# Patient Record
Sex: Female | Born: 1955 | Race: White | Hispanic: No | Marital: Married | Smoking: Never smoker
Health system: Southern US, Community
[De-identification: ages and names within clinical notes are randomized; demographics above are authoritative.]

## PROBLEM LIST (undated history)

## (undated) DIAGNOSIS — K219 Gastro-esophageal reflux disease without esophagitis: Secondary | ICD-10-CM

## (undated) DIAGNOSIS — E785 Hyperlipidemia, unspecified: Secondary | ICD-10-CM

## (undated) DIAGNOSIS — F32A Depression, unspecified: Secondary | ICD-10-CM

## (undated) DIAGNOSIS — N200 Calculus of kidney: Secondary | ICD-10-CM

## (undated) DIAGNOSIS — F410 Panic disorder [episodic paroxysmal anxiety] without agoraphobia: Secondary | ICD-10-CM

## (undated) DIAGNOSIS — F329 Major depressive disorder, single episode, unspecified: Secondary | ICD-10-CM

## (undated) DIAGNOSIS — R519 Headache, unspecified: Secondary | ICD-10-CM

## (undated) HISTORY — DX: Calculus of kidney: N20.0

## (undated) HISTORY — DX: Major depressive disorder, single episode, unspecified: F32.9

## (undated) HISTORY — DX: Depression, unspecified: F32.A

## (undated) HISTORY — PX: APPENDECTOMY: SHX54

## (undated) HISTORY — PX: OTHER SURGICAL HISTORY: SHX169

---

## 2003-03-20 ENCOUNTER — Encounter: Payer: Self-pay | Admitting: Internal Medicine

## 2003-03-20 ENCOUNTER — Encounter: Admission: RE | Admit: 2003-03-20 | Discharge: 2003-03-20 | Payer: Self-pay | Admitting: Internal Medicine

## 2004-10-05 ENCOUNTER — Ambulatory Visit: Payer: Self-pay | Admitting: Internal Medicine

## 2005-06-03 ENCOUNTER — Other Ambulatory Visit: Admission: RE | Admit: 2005-06-03 | Discharge: 2005-06-03 | Payer: Self-pay | Admitting: Gynecology

## 2006-06-19 ENCOUNTER — Other Ambulatory Visit: Admission: RE | Admit: 2006-06-19 | Discharge: 2006-06-19 | Payer: Self-pay | Admitting: Gynecology

## 2007-07-04 LAB — CONVERTED CEMR LAB

## 2007-10-19 ENCOUNTER — Emergency Department (HOSPITAL_COMMUNITY): Admission: EM | Admit: 2007-10-19 | Discharge: 2007-10-19 | Payer: Self-pay | Admitting: Emergency Medicine

## 2008-04-01 ENCOUNTER — Ambulatory Visit: Payer: Self-pay | Admitting: Internal Medicine

## 2008-04-01 ENCOUNTER — Telehealth (INDEPENDENT_AMBULATORY_CARE_PROVIDER_SITE_OTHER): Payer: Self-pay | Admitting: *Deleted

## 2008-04-01 DIAGNOSIS — M549 Dorsalgia, unspecified: Secondary | ICD-10-CM | POA: Insufficient documentation

## 2008-04-01 DIAGNOSIS — Z87442 Personal history of urinary calculi: Secondary | ICD-10-CM

## 2008-04-01 DIAGNOSIS — F329 Major depressive disorder, single episode, unspecified: Secondary | ICD-10-CM | POA: Insufficient documentation

## 2008-04-01 LAB — CONVERTED CEMR LAB
Basophils Absolute: 0 10*3/uL (ref 0.0–0.1)
Eosinophils Absolute: 0.1 10*3/uL (ref 0.0–0.7)
HCT: 38.1 % (ref 36.0–46.0)
Ketones, urine, test strip: NEGATIVE
MCHC: 33.4 g/dL (ref 30.0–36.0)
MCV: 91.3 fL (ref 78.0–100.0)
Monocytes Absolute: 0.6 10*3/uL (ref 0.1–1.0)
Nitrite: NEGATIVE
Platelets: 253 10*3/uL (ref 150–400)
RDW: 12.3 % (ref 11.5–14.6)
Urobilinogen, UA: 0.2

## 2008-04-02 ENCOUNTER — Encounter: Payer: Self-pay | Admitting: Internal Medicine

## 2008-04-03 ENCOUNTER — Ambulatory Visit: Payer: Self-pay | Admitting: Cardiology

## 2008-04-03 ENCOUNTER — Encounter (INDEPENDENT_AMBULATORY_CARE_PROVIDER_SITE_OTHER): Payer: Self-pay | Admitting: General Surgery

## 2008-04-03 ENCOUNTER — Telehealth: Payer: Self-pay | Admitting: Internal Medicine

## 2008-04-03 ENCOUNTER — Ambulatory Visit (HOSPITAL_COMMUNITY): Admission: EM | Admit: 2008-04-03 | Discharge: 2008-04-04 | Payer: Self-pay | Admitting: Emergency Medicine

## 2008-04-10 ENCOUNTER — Ambulatory Visit: Payer: Self-pay | Admitting: Internal Medicine

## 2008-04-17 ENCOUNTER — Ambulatory Visit: Payer: Self-pay | Admitting: Family Medicine

## 2008-04-17 DIAGNOSIS — R5381 Other malaise: Secondary | ICD-10-CM

## 2008-04-17 DIAGNOSIS — G43909 Migraine, unspecified, not intractable, without status migrainosus: Secondary | ICD-10-CM | POA: Insufficient documentation

## 2008-04-17 DIAGNOSIS — R5383 Other fatigue: Secondary | ICD-10-CM

## 2008-04-21 LAB — CONVERTED CEMR LAB
ALT: 23 units/L (ref 0–35)
Alkaline Phosphatase: 80 units/L (ref 39–117)
CO2: 23 meq/L (ref 19–32)
Cholesterol: 266 mg/dL — ABNORMAL HIGH (ref 0–200)
Eosinophils Absolute: 0.2 10*3/uL (ref 0.0–0.7)
Folate: >20 ng/mL
LDL Cholesterol: 178 mg/dL — ABNORMAL HIGH (ref 0–99)
Lymphocytes Relative: 29 % (ref 12–46)
Lymphs Abs: 2.2 10*3/uL (ref 0.7–4.0)
MCV: 93.1 fL (ref 78.0–100.0)
Neutrophils Relative %: 61 % (ref 43–77)
Platelets: 293 10*3/uL (ref 150–400)
Sed Rate: 17 mm/hr (ref 0–22)
Sodium: 141 meq/L (ref 135–145)
Total Bilirubin: 0.4 mg/dL (ref 0.3–1.2)
Total Protein: 7.3 g/dL (ref 6.0–8.3)
Uric Acid, Serum: 4.9 mg/dL (ref 2.4–7.0)
VLDL: 38 mg/dL (ref 0–40)
Vitamin B-12: 595 pg/mL (ref 211–911)
WBC: 7.6 10*3/uL (ref 4.0–10.5)

## 2008-04-24 ENCOUNTER — Telehealth: Payer: Self-pay | Admitting: Family Medicine

## 2008-05-05 ENCOUNTER — Encounter: Payer: Self-pay | Admitting: Internal Medicine

## 2008-07-28 ENCOUNTER — Ambulatory Visit: Payer: Self-pay | Admitting: Family Medicine

## 2008-07-28 DIAGNOSIS — E785 Hyperlipidemia, unspecified: Secondary | ICD-10-CM

## 2008-07-28 DIAGNOSIS — J309 Allergic rhinitis, unspecified: Secondary | ICD-10-CM | POA: Insufficient documentation

## 2008-08-18 ENCOUNTER — Ambulatory Visit: Payer: Self-pay | Admitting: Occupational Medicine

## 2008-08-18 ENCOUNTER — Encounter (INDEPENDENT_AMBULATORY_CARE_PROVIDER_SITE_OTHER): Payer: Self-pay | Admitting: Occupational Medicine

## 2009-07-15 ENCOUNTER — Ambulatory Visit: Payer: Self-pay | Admitting: Family Medicine

## 2009-07-28 ENCOUNTER — Encounter: Admission: RE | Admit: 2009-07-28 | Discharge: 2009-07-28 | Payer: Self-pay | Admitting: Gynecology

## 2009-07-30 ENCOUNTER — Encounter: Payer: Self-pay | Admitting: Family Medicine

## 2009-08-05 ENCOUNTER — Encounter: Admission: RE | Admit: 2009-08-05 | Discharge: 2009-08-05 | Payer: Self-pay | Admitting: Gynecology

## 2009-10-11 ENCOUNTER — Emergency Department (HOSPITAL_COMMUNITY): Admission: EM | Admit: 2009-10-11 | Discharge: 2009-10-11 | Payer: Self-pay | Admitting: Emergency Medicine

## 2009-10-26 ENCOUNTER — Ambulatory Visit (HOSPITAL_BASED_OUTPATIENT_CLINIC_OR_DEPARTMENT_OTHER): Admission: RE | Admit: 2009-10-26 | Discharge: 2009-10-26 | Payer: Self-pay | Admitting: Urology

## 2010-06-22 ENCOUNTER — Ambulatory Visit: Payer: Self-pay | Admitting: Emergency Medicine

## 2010-08-03 ENCOUNTER — Encounter: Admission: RE | Admit: 2010-08-03 | Discharge: 2010-08-03 | Payer: Self-pay | Admitting: Gynecology

## 2010-08-19 ENCOUNTER — Ambulatory Visit: Payer: Self-pay | Admitting: Family Medicine

## 2010-09-21 ENCOUNTER — Ambulatory Visit: Payer: Self-pay | Admitting: Family Medicine

## 2010-09-21 ENCOUNTER — Encounter
Admission: RE | Admit: 2010-09-21 | Discharge: 2010-09-21 | Payer: Self-pay | Source: Home / Self Care | Attending: Family Medicine | Admitting: Family Medicine

## 2010-09-21 DIAGNOSIS — R112 Nausea with vomiting, unspecified: Secondary | ICD-10-CM | POA: Insufficient documentation

## 2010-09-21 DIAGNOSIS — R1013 Epigastric pain: Secondary | ICD-10-CM | POA: Insufficient documentation

## 2010-09-22 ENCOUNTER — Telehealth: Payer: Self-pay | Admitting: Family Medicine

## 2010-09-24 ENCOUNTER — Encounter
Admission: RE | Admit: 2010-09-24 | Discharge: 2010-09-24 | Payer: Self-pay | Source: Home / Self Care | Attending: Family Medicine | Admitting: Family Medicine

## 2010-10-05 ENCOUNTER — Encounter: Payer: Self-pay | Admitting: Family Medicine

## 2010-10-06 ENCOUNTER — Encounter: Payer: Self-pay | Admitting: Family Medicine

## 2010-11-02 NOTE — Assessment & Plan Note (Signed)
Summary: COTTON LODGED IN RIGHT EAR/TJ (procedure room)   Vital Signs:  Patient Profile:   55 Years Old Female CC:      q-tip end stuck in right ear Height:     61 inches Weight:      157 pounds O2 Sat:      100 % O2 treatment:    Room Air Temp:     98.5 degrees F oral Pulse rate:   84 / minute Resp:     12 per minute BP sitting:   116 / 73  (left arm) Cuff size:   regular  Pt. in pain?   no  Vitals Entered By: Lajean Saver RN (August 19, 2010 12:44 PM)                   Updated Prior Medication List: EFFEXOR XR 150 MG  XR24H-CAP (VENLAFAXINE HCL) Take 1 tablet by mouth once a day VIVELLE-DOT 0.1 MG/24HR  PTTW (ESTRADIOL) Apply once daily PROMETRIUM 100 MG  CAPS (PROGESTERONE MICRONIZED)  IMITREX 100 MG  TABS (SUMATRIPTAN SUCCINATE)  NASONEX 50 MCG/ACT SUSP (MOMETASONE FUROATE) 2 sprays each nostril daily  Current Allergies (reviewed today): ! VICODIN (HYDROCODONE-ACETAMINOPHEN)History of Present Illness Chief Complaint: q-tip end stuck in right ear History of Present Illness:  Subjective:  Patient believes that cotton tip of a cotton applicator may have become lodged in her right ear while she was cleaning ear this AM.  No pain  REVIEW OF SYSTEMS Constitutional Symptoms      Denies fever, chills, night sweats, weight loss, weight gain, and fatigue.  Eyes       Denies change in vision, eye pain, eye discharge, glasses, contact lenses, and eye surgery. Ear/Nose/Throat/Mouth       Denies hearing loss/aids, change in hearing, ear pain, ear discharge, dizziness, frequent runny nose, frequent nose bleeds, sinus problems, sore throat, hoarseness, and tooth pain or bleeding.  Respiratory       Denies dry cough, productive cough, wheezing, shortness of breath, asthma, bronchitis, and emphysema/COPD.  Cardiovascular       Denies murmurs, chest pain, and tires easily with exhertion.    Gastrointestinal       Denies stomach pain, nausea/vomiting, diarrhea, constipation,  blood in bowel movements, and indigestion. Genitourniary       Denies painful urination, kidney stones, and loss of urinary control. Neurological       Denies paralysis, seizures, and fainting/blackouts. Musculoskeletal       Denies muscle pain, joint pain, joint stiffness, decreased range of motion, redness, swelling, muscle weakness, and gout.  Skin       Denies bruising, unusual mles/lumps or sores, and hair/skin or nail changes.  Psych       Denies mood changes, temper/anger issues, anxiety/stress, speech problems, depression, and sleep problems. Other Comments: Patient was cleaning ears this AM and the end of a Q-tip became lodged in her right ear   Past History:  Past Medical History: Reviewed history from 04/17/2008 and no changes required. Depression Nephrolithiasis, hx of Dr. Nicholas Lose (GYN).   Past Surgical History: Reviewed history from 04/17/2008 and no changes required. G 2 P 2 ; open right knee surgery  1984;  calculus retrieval Colonoscopy 2008 neg Appendectomy-04/03/2008  Family History: Reviewed history from 06/22/2010 and no changes required. Father: Healthy Mother: CVA @ 41 Siblings: sister bipolar  Social History: Reviewed history from 04/17/2008 and no changes required. 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   Never  Smoked Alcohol use-yes Drug use-no Regular exercise-no   Objective:  No acute distress  Right ear:  Small piece of cerumin distally but no foreign body.  After lavage, still no foreign body, and tympanic membrane and external auditory canal appear normal. Assessment New Problems: CERUMEN IMPACTION, RIGHT (ICD-380.4)   The patient and/or caregiver has been counseled thoroughly with regard to medications prescribed including dosage, schedule, interactions, rationale for use, and possible side effects and they verbalize understanding.  Diagnoses and expected course of recovery discussed and will return if not  improved as expected or if the condition worsens. Patient and/or caregiver verbalized understanding.   Orders Added: 1)  Clear Outer Ear canal [69200] 2)  Est. Patient Level III [[address]]

## 2010-11-02 NOTE — Assessment & Plan Note (Signed)
Summary: UPPER RESPIRATORY   Vital Signs:  Patient Profile:   55 Years Old Female CC:      Cold & URI symptoms x 4 days Height:     61 inches O2 Sat:      100 % O2 treatment:    Room Air Temp:     97.7 degrees F oral Pulse rate:   88 / minute Resp:     14 per minute BP sitting:   113 / 74  (left arm) Cuff size:   regular  Vitals Entered By: Lajean Saver RN (June 22, 2010 8:52 AM)                  Updated Prior Medication List: ZANTAC 150 MAXIMUM STRENGTH 150 MG  TABS (RANITIDINE HCL) 1 two times a day ac EFFEXOR XR 150 MG  XR24H-CAP (VENLAFAXINE HCL) Take 1 tablet by mouth once a day VIVELLE-DOT 0.1 MG/24HR  PTTW (ESTRADIOL) Apply once daily PROMETRIUM 100 MG  CAPS (PROGESTERONE MICRONIZED)  IMITREX 100 MG  TABS (SUMATRIPTAN SUCCINATE)   Current Allergies (reviewed today): ! VICODIN (HYDROCODONE-ACETAMINOPHEN)History of Present Illness Chief Complaint: Cold & URI symptoms x 4 days History of Present Illness: Patient complains of onset of cold symptoms for 4 days.  She has been using Nyquil which is helping a little bit.  She gets these symptoms every fall. No sore throat + cough No pleuritic pain No wheezing + nasal congestion + post-nasal drainage + sinus pain/pressure No itchy/red eyes No earache No hemoptysis No SOB + chills/sweats No nausea No vomiting No abdominal pain No diarrhea No skin rashes No fatigue No myalgias No headache   REVIEW OF SYSTEMS Constitutional Symptoms       Complains of chills, night sweats, and fatigue.     Denies fever, weight loss, and weight gain.  Eyes       Complains of eye drainage.      Denies change in vision, eye pain, glasses, contact lenses, and eye surgery. Ear/Nose/Throat/Mouth       Complains of frequent runny nose, sinus problems, and sore throat.      Denies hearing loss/aids, change in hearing, ear pain, ear discharge, dizziness, frequent nose bleeds, hoarseness, and tooth pain or bleeding.   Respiratory       Complains of productive cough.      Denies dry cough, wheezing, shortness of breath, asthma, bronchitis, and emphysema/COPD.  Cardiovascular       Denies murmurs, chest pain, and tires easily with exhertion.    Gastrointestinal       Denies stomach pain, nausea/vomiting, diarrhea, constipation, blood in bowel movements, and indigestion. Genitourniary       Denies painful urination, kidney stones, and loss of urinary control. Neurological       Denies paralysis, seizures, and fainting/blackouts. Musculoskeletal       Denies muscle pain, joint pain, joint stiffness, decreased range of motion, redness, swelling, muscle weakness, and gout.  Skin       Denies bruising, unusual mles/lumps or sores, and hair/skin or nail changes.  Psych       Denies mood changes, temper/anger issues, anxiety/stress, speech problems, depression, and sleep problems.  Past History:  Past Medical History: Reviewed history from 04/17/2008 and no changes required. Depression Nephrolithiasis, hx of Dr. Nicholas Lose (GYN).   Past Surgical History: Reviewed history from 04/17/2008 and no changes required. G 2 P 2 ; open right knee surgery  1984;  calculus retrieval Colonoscopy 2008 neg Appendectomy-04/03/2008  Family History:  Father: Healthy Mother: CVA @ 34 Siblings: sister bipolar  Social History: Reviewed history from 04/17/2008 and no changes required. 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   Never Smoked Alcohol use-yes Drug use-no Regular exercise-no Physical Exam General appearance: well developed, well nourished, no acute distress Ears: normal, no lesions or deformities Nasal: clear discharge Oral/Pharynx: clear PND Neck: neck supple,  trachea midline, no masses Chest/Lungs: no rales, wheezes, or rhonchi bilateral, breath sounds equal without effort Heart: regular rate and  rhythm, no murmur Skin: no obvious rashes or lesions MSE: oriented to time,  place, and person Assessment New Problems: UPPER RESPIRATORY INFECTION, ACUTE (ICD-465.9)   Patient Education: Patient and/or caregiver instructed in the following: rest, fluids, Tylenol prn.  Plan New Medications/Changes: CHERATUSSIN AC 100-10 MG/5ML SYRP (GUAIFENESIN-CODEINE) 5cc by mouth every 6 hours as needed for cough  #5oz x 0, 06/22/2010, Hoyt Koch MD AMOXICILLIN 500 MG TABS (AMOXICILLIN) 1 tab by mouth three times a day for 10 days  #30 x 0, 06/22/2010, Hoyt Koch MD NASONEX 50 MCG/ACT SUSP (MOMETASONE FUROATE) 2 sprays each nostril daily  #1 x 3, 06/22/2010, Hoyt Koch MD  New Orders: Est. Patient Level II [69629] Planning Comments:   Follow-up with your primary care physician if not improving or if getting worse   The patient and/or caregiver has been counseled thoroughly with regard to medications prescribed including dosage, schedule, interactions, rationale for use, and possible side effects and they verbalize understanding.  Diagnoses and expected course of recovery discussed and will return if not improved as expected or if the condition worsens. Patient and/or caregiver verbalized understanding.  Prescriptions: CHERATUSSIN AC 100-10 MG/5ML SYRP (GUAIFENESIN-CODEINE) 5cc by mouth every 6 hours as needed for cough  #5oz x 0   Entered and Authorized by:   Hoyt Koch MD   Signed by:   Hoyt Koch MD on 06/22/2010   Method used:   Print then Give to Patient   RxID:   [id number] AMOXICILLIN 500 MG TABS (AMOXICILLIN) 1 tab by mouth three times a day for 10 days  #30 x 0   Entered and Authorized by:   Hoyt Koch MD   Signed by:   Hoyt Koch MD on 06/22/2010   Method used:   Print then Give to Patient   RxID:   [id number] NASONEX 50 MCG/ACT SUSP (MOMETASONE FUROATE) 2 sprays each nostril daily  #1 x 3   Entered and Authorized by:   Hoyt Koch MD   Signed by:   Hoyt Koch MD on 06/22/2010   Method  used:   Print then Give to Patient   RxID:   [id number]   Orders Added: 1)  Est. Patient Level II [60630]

## 2010-11-02 NOTE — Assessment & Plan Note (Signed)
Summary: FLU SHOT  Nurse Visit Flu Vaccine Consent Questions     Do you have a history of severe allergic reactions to this vaccine? no    Any prior history of allergic reactions to egg and/or gelatin? no    Do you have a sensitivity to the preservative Thimersol? no    Do you have a past history of Guillan-Barre Syndrome? no    Do you currently have an acute febrile illness? no    Have you ever had a severe reaction to latex? no    Vaccine information given and explained to patient? yes    Are you currently pregnant? no    Lot Number:[id number]   Exp Date:04/02/2011   Manufacturer: Designer, industrial/product Deltoid IM    Vital Signs:  Patient profile:   55 year old female Temp:     97.7 degrees F oral  Vitals Entered By: Lajean Saver RN (June 22, 2010 9:50 AM)  Allergies: 1)  ! Vicodin (Hydrocodone-Acetaminophen)  Orders Added: 1)  Admin 1st Vaccine [90471] 2)  Flu Vaccine 34yrs + [21308]

## 2010-11-04 NOTE — Progress Notes (Signed)
Summary: ultasound/ CT ?  Phone Note Call from Patient Call back at Work Phone [phone]   Caller: Patient Call For: Nani Gasser MD Summary of Call: Pt calls and states was given results of xray yesterday and was advised that she needed a CT. Wants to know if this has been scheduled. I looked i see per MD note that she just needs ultrasound. Can you look into this please behind me and clarify to pt Initial call taken by: Kathlene November LPN,  September 22, 2010 1:44 PM  Follow-up for Phone Call        called and left messag on vm . we scheduled her for an abd U/S Follow-up by: Avon Gully CMA, Duncan Dull),  September 22, 2010 2:27 PM

## 2010-11-04 NOTE — Assessment & Plan Note (Signed)
Summary: Abdominal Pain and Nausea   Vital Signs:  Patient profile:   55 year old female Height:      61 inches Weight:      153 pounds Pulse rate:   58 / minute BP sitting:   106 / 66  (right arm) Cuff size:   regular  Vitals Entered By: Avon Gully CMA, Duncan Dull) (September 21, 2010 9:26 AM) CC: abd pain and nausea 2-3 weeks, went to urgent care and they did ct of abd and pt was very backed up, pt has not had a BM in a week   Primary Care Provider:  Nani Gasser, MD  CC:  abd pain and nausea 2-3 weeks, went to urgent care and they did ct of abd and pt was very backed up, and pt has not had a BM in a week.  History of Present Illness: abd pain and nausea 2-3 weeks, went to urgent care and they did Plain xray  of abd and pt was very backed up, pt has not had a BM in a week. Nuase is constant.  Dec appetitie.  No fever.  Does have some chronic constipation.  Some discomfort in her mid back.  Woke up very nauseated after going to a party the night before. No alcohol. Would get cramping after eating. Took  a laxative last monday to have a BM.  Then on Sunday has nausea and vomiting about an 1.5 after eating. Had similar sxs in july but resolved on its own.  Says has blood work that was normal. Given omeprazole and odansatran.  these medications helped some.nothing significantly improved her pain.  Most of her stomach discomfort is in the epigastric area.  Current Medications (verified): 1)  Vivelle-Dot 0.1 Mg/24hr  Pttw (Estradiol) .... Apply Once Daily 2)  Prometrium 100 Mg  Caps (Progesterone Micronized) 3)  Imitrex 100 Mg  Tabs (Sumatriptan Succinate) 4)  Nasonex 50 Mcg/act Susp (Mometasone Furoate) .... 2 Sprays Each Nostril Daily 5)  Cymbalta 60 Mg Cpep (Duloxetine Hcl) .... Take One Tablet By Mouth Once A Day  Allergies (verified): 1)  ! Vicodin (Hydrocodone-Acetaminophen)  Comments:  Nurse/Medical Assistant: The patient's medications and allergies were reviewed with the  patient and were updated in the Medication and Allergy Lists. Avon Gully CMA, Duncan Dull) (September 21, 2010 9:28 AM)  Past History:  Past Medical History: Last updated: 04/17/2008 Depression Nephrolithiasis, hx of Dr. Nicholas Lose (GYN).   Past Surgical History: Last updated: 04/17/2008 G 2 P 2 ; open right knee surgery  1984;  calculus retrieval Colonoscopy 2008 neg Appendectomy-04/03/2008  Family History: Reviewed history from 06/22/2010 and no changes required. Father: Healthy Mother: CVA @ 68 Siblings: sister bipolar  Social History: Reviewed history from 04/17/2008 and no changes required. 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   Never Smoked Alcohol use-yes Drug use-no Regular exercise-no  Physical Exam  General:  Well-developed,well-nourished,in no acute distress; alert,appropriate and cooperative throughout examination Head:  Normocephalic and atraumatic without obvious abnormalities. No apparent alopecia or balding. Eyes:  No corneal or conjunctival inflammation noted. EOMI. Perrla.  Ears:  External ear exam shows no significant lesions or deformities.  Otoscopic examination reveals clear canals, tympanic membranes are intact bilaterally without bulging, retraction, inflammation or discharge. Hearing is grossly normal bilaterally. Mouth:  Oral mucosa and oropharynx without lesions or exudates.  Teeth in good repair. Neck:  No deformities, masses, or tenderness noted. no thyromegaly. Lungs:  Normal respiratory effort, chest expands symmetrically. Lungs are  clear to auscultation, no crackles or wheezes. Heart:  Normal rate and regular rhythm. S1 and S2 normal without gallop, murmur, click, rub or other extra sounds. Abdomen:  Bowel sounds positive,abdomen soft without masses, organomegaly or hernias noted. diffusely tender. Pulses:  radial pulses 2+ bilaterally Extremities:  no lower extremity edema Skin:  no rashes.   Cervical Nodes:  No  lymphadenopathy noted Psych:  Cognition and judgment appear intact. Alert and cooperative with normal attention span and concentration. No apparent delusions, illusions, hallucinations   Impression & Recommendations:  Problem # 1:  EPIGASTRIC PAIN (ICD-789.06) we discussed options.  I think the most important thing is to start with a KUB again and make sure that she is cleared the stool from her bowels.  I would also like to get an ultrasound of her gallbladder to make sure she does not have cholecystitis.  She evidently had normal blood counts done but I do not have a copy of these to look at today.  If she is still full of stool then they will be unable to do the ultrasound, that I am starting with the x-ray today.  She can continue the omeprazole and he again is drawn as needed for stomach upset and for nausea.  If the pain becomes severe or she starts vomiting again to seek a immediately to the emergency department. Orders: T-*Unlisted Diagnostic X-ray test/procedure (60454)  Problem # 2:  NAUSEA AND VOMITING (ICD-787.01) we discussed options.  I think the most important thing is to start with a KUB again and make sure that she is cleared the stool from her bowels.  I would also like to get an ultrasound of her gallbladder to make sure she does not have cholecystitis.  She evidently had normal blood counts done but I do not have a copy of these to look at today.  If she is still full of stool then they will be unable to do the ultrasound, that I am starting with the x-ray today.  She can continue the omeprazole and he again is drawn as needed for stomach upset and for nausea.  If the pain becomes severe or she starts vomiting again to seek a immediately to the emergency department. Orders: T-*Unlisted Diagnostic X-ray test/procedure ([id number])  Complete Medication List: 1)  Vivelle-dot 0.1 Mg/24hr Pttw (Estradiol) .... Apply once daily 2)  Prometrium 100 Mg Caps (Progesterone micronized) 3)   Imitrex 100 Mg Tabs (Sumatriptan succinate) 4)  Nasonex 50 Mcg/act Susp (Mometasone furoate) .... 2 sprays each nostril daily 5)  Cymbalta 60 Mg Cpep (Duloxetine hcl) .... Take one tablet by mouth once a day   Orders Added: 1)  T-*Unlisted Diagnostic X-ray test/procedure [91478] 2)  Est. Patient Level IV [29562]

## 2010-11-04 NOTE — Consult Note (Signed)
Summary: Arloa Koh [facility]   Imported By: Lanelle Bal 10/28/2010 08:49:40  _____________________________________________________________________  External Attachment:    Type:   Image     Comment:   External Document

## 2010-12-19 LAB — CBC
HCT: 42 % (ref 36.0–46.0)
Hemoglobin: 14 g/dL (ref 12.0–15.0)
MCHC: 33.4 g/dL (ref 30.0–36.0)
MCV: 90.1 fL (ref 78.0–100.0)
Platelets: 237 K/uL (ref 150–400)
RBC: 4.66 MIL/uL (ref 3.87–5.11)
RDW: 14.1 % (ref 11.5–15.5)
WBC: 7.5 K/uL (ref 4.0–10.5)

## 2010-12-19 LAB — COMPREHENSIVE METABOLIC PANEL WITH GFR
ALT: 19 U/L (ref 0–35)
AST: 23 U/L (ref 0–37)
Albumin: 3.8 g/dL (ref 3.5–5.2)
Alkaline Phosphatase: 87 U/L (ref 39–117)
BUN: 8 mg/dL (ref 6–23)
CO2: 26 meq/L (ref 19–32)
Calcium: 9.3 mg/dL (ref 8.4–10.5)
Chloride: 106 meq/L (ref 96–112)
Creatinine, Ser: 0.76 mg/dL (ref 0.4–1.2)
GFR calc non Af Amer: >60 mL/min
Glucose, Bld: 136 mg/dL — ABNORMAL HIGH (ref 70–99)
Potassium: 3.6 meq/L (ref 3.5–5.1)
Sodium: 140 meq/L (ref 135–145)
Total Bilirubin: 0.6 mg/dL (ref 0.3–1.2)
Total Protein: 7.5 g/dL (ref 6.0–8.3)

## 2010-12-19 LAB — URINALYSIS, ROUTINE W REFLEX MICROSCOPIC
Bilirubin Urine: NEGATIVE
Nitrite: NEGATIVE
Specific Gravity, Urine: 1.014 (ref 1.005–1.030)
pH: 7.5 (ref 5.0–8.0)

## 2010-12-19 LAB — LIPASE, BLOOD: Lipase: 23 U/L (ref 11–59)

## 2010-12-19 LAB — URINE CULTURE
Colony Count: NO GROWTH
Culture: NO GROWTH

## 2010-12-19 LAB — URINE MICROSCOPIC-ADD ON

## 2010-12-19 LAB — DIFFERENTIAL
Eosinophils Relative: 1 % (ref 0–5)
Lymphocytes Relative: 20 % (ref 12–46)
Lymphs Abs: 1.5 10*3/uL (ref 0.7–4.0)
Monocytes Absolute: 0.4 10*3/uL (ref 0.1–1.0)
Neutro Abs: 5.4 10*3/uL (ref 1.7–7.7)

## 2010-12-19 LAB — POCT HEMOGLOBIN-HEMACUE: Hemoglobin: 13.6 g/dL (ref 12.0–15.0)

## 2010-12-27 ENCOUNTER — Inpatient Hospital Stay (INDEPENDENT_AMBULATORY_CARE_PROVIDER_SITE_OTHER)
Admission: RE | Admit: 2010-12-27 | Discharge: 2010-12-27 | Disposition: A | Discharge status: Home / Self Care | Payer: 59 | Source: Ambulatory Visit | Attending: Family Medicine | Admitting: Family Medicine

## 2010-12-27 ENCOUNTER — Encounter: Payer: Self-pay | Admitting: Family Medicine

## 2010-12-27 DIAGNOSIS — M545 Low back pain: Secondary | ICD-10-CM

## 2010-12-27 DIAGNOSIS — M533 Sacrococcygeal disorders, not elsewhere classified: Secondary | ICD-10-CM

## 2011-01-04 NOTE — Letter (Signed)
Summary: Internal Correspondence  Internal Correspondence   Imported By: Dannette Barbara 12/27/2010 10:18:31  _____________________________________________________________________  External Attachment:    Type:   Image     Comment:   External Document

## 2011-01-04 NOTE — Letter (Signed)
Summary: Out of Work  [facility]  [address]   [place], [place] [address]   Phone: [phone]  Fax: [phone]    December 27, 2010   Employee:  Connie May Ton    To Whom It May Concern:   For Medical reasons, please excuse the above named employee from work today and tomorrow.   If you need additional information, please feel free to contact our office.         Sincerely,    Donna Christen MD

## 2011-01-04 NOTE — Letter (Signed)
Summary: External Correspondence  External Correspondence   Imported By: Dannette Barbara 12/27/2010 11:00:35  _____________________________________________________________________  External Attachment:    Type:   Image     Comment:   External Document

## 2011-01-04 NOTE — Assessment & Plan Note (Signed)
Summary: LOWER BACK PAIN? room 5   Vital Signs:  Patient Profile:   55 Years Old Female CC:      Lower back pain x 2 days Height:     61 inches Weight:      153 pounds O2 Sat:      96 % O2 treatment:    Room Air Temp:     98.3 degrees F oral Pulse rate:   80 / minute Pulse rhythm:   regular Resp:     16 per minute BP sitting:   112 / 72  (left arm) Cuff size:   regular  Vitals Entered By: Emilio Math (December 27, 2010 9:27 AM)                  Current Allergies (reviewed today): ! VICODIN (HYDROCODONE-ACETAMINOPHEN)History of Present Illness Chief Complaint: Lower back pain x 2 days History of Present Illness:  Subjective:  Patient was passenger in a auto two days ago for 9 hours.  She noticed bilateral aching in her lower back after the trip, and the pain has gradually migrated up to her mid-back.  The pain is worse with movement, and awakens her at night.  No bowel or bladder dysfunction.  No saddle numbness.  No rash.  No fevers, chills, and sweats   Current Meds VIVELLE-DOT 0.1 MG/24HR  PTTW (ESTRADIOL) Apply once daily PROMETRIUM 100 MG  CAPS (PROGESTERONE MICRONIZED)  IMITREX 100 MG  TABS (SUMATRIPTAN SUCCINATE)  NASONEX 50 MCG/ACT SUSP (MOMETASONE FUROATE) 2 sprays each nostril daily CYMBALTA 60 MG CPEP (DULOXETINE HCL) take one tablet by mouth once a day NAPROXEN 375 MG TABS (NAPROXEN) 1 by mouth q12hr pc SKELAXIN 800 MG TABS (METAXALONE) 1 by mouth two times a day to three times a day as needed PERCOCET 5-325 MG TABS (OXYCODONE-ACETAMINOPHEN) 1 by mouth hs as needed pain  REVIEW OF SYSTEMS Constitutional Symptoms      Denies fever, chills, night sweats, weight loss, weight gain, and fatigue.  Eyes       Denies change in vision, eye pain, eye discharge, glasses, contact lenses, and eye surgery. Ear/Nose/Throat/Mouth       Denies hearing loss/aids, change in hearing, ear pain, ear discharge, dizziness, frequent runny nose, frequent nose bleeds, sinus problems,  sore throat, hoarseness, and tooth pain or bleeding.  Respiratory       Denies dry cough, productive cough, wheezing, shortness of breath, asthma, bronchitis, and emphysema/COPD.  Cardiovascular       Denies murmurs, chest pain, and tires easily with exhertion.    Gastrointestinal       Denies stomach pain, nausea/vomiting, diarrhea, constipation, blood in bowel movements, and indigestion. Genitourniary       Denies painful urination, kidney stones, and loss of urinary control. Neurological       Denies paralysis, seizures, and fainting/blackouts. Musculoskeletal       Complains of muscle pain, joint pain, joint stiffness, and decreased range of motion.      Denies redness, swelling, muscle weakness, and gout.  Skin       Denies bruising, unusual mles/lumps or sores, and hair/skin or nail changes.  Psych       Denies mood changes, temper/anger issues, anxiety/stress, speech problems, depression, and sleep problems.  Past History:  Past Medical History: Reviewed history from 04/17/2008 and no changes required. Depression Nephrolithiasis, hx of Dr. Nicholas Lose (GYN).   Past Surgical History: Reviewed history from 04/17/2008 and no changes required. G 2 P 2 ; open right  knee surgery  1984;  calculus retrieval Colonoscopy 2008 neg Appendectomy-04/03/2008  Family History: Reviewed history from 06/22/2010 and no changes required. Father: Healthy Mother: CVA @ 42 Siblings: sister bipolar  Social History: Reviewed history from 04/17/2008 and no changes required. 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   Never Smoked Alcohol use-yes Drug use-no Regular exercise-no   Objective:  Appearance:  Patient appears healthy, stated age, and in no acute distress.  However, she appears to be more comfortable standing Eyes:  Pupils are equal, round, and reactive to light and accomdation.  Extraocular movement is intact.  Conjunctivae are not inflamed.  Neck:   Supple Lungs:  Clear to auscultation.  Breath sounds are equal.  Heart:  Regular rate and rhythm without murmurs, rubs, or gallops.  Abdomen:  Nontender without masses or hepatosplenomegaly.  Bowel sounds are present.  No CVA or flank tenderness.   Back:  Decreased range of motion.  Can heel/toe walk and squat without difficulty.  Tenderness in lumbar and  Sacral area.  Straight leg raising test is negative.  Sitting knee extension test is negative.  Strength and sensation in the lower extremities is normal.  Patellar and achilles reflexes are normal.  Extremities:  No edema.   Skin:  No rash Assessment New Problems: BACK PAIN, LUMBAR (ICD-724.2) SACROILIAC JOINT DYSFUNCTION (ICD-724.6)   Plan New Medications/Changes: PERCOCET 5-325 MG TABS (OXYCODONE-ACETAMINOPHEN) 1 by mouth hs as needed pain  #5 (five) x 0, 12/27/2010, Donna Christen MD SKELAXIN 800 MG TABS (METAXALONE) 1 by mouth two times a day to three times a day as needed  #15 x 0, 12/27/2010, Donna Christen MD NAPROXEN 375 MG TABS (NAPROXEN) 1 by mouth q12hr pc  #20 x 0, 12/27/2010, Donna Christen MD  New Orders: Est. Patient Level III [phone] Planning Comments:   Begin applying ice pack several times daily.  Begin NSAID and muscle relaxant.  Analgesic at bedtime Begin back exercises ([facility] information and instruction patient handout given)  Followup with [facility] if not improved in two weeks.    The patient and/or caregiver has been counseled thoroughly with regard to medications prescribed including dosage, schedule, interactions, rationale for use, and possible side effects and they verbalize understanding.  Diagnoses and expected course of recovery discussed and will return if not improved as expected or if the condition worsens. Patient and/or caregiver verbalized understanding.  Prescriptions: PERCOCET 5-325 MG TABS (OXYCODONE-ACETAMINOPHEN) 1 by mouth hs as needed pain  #5 (five) x 0   Entered and  Authorized by:   Donna Christen MD   Signed by:   Donna Christen MD on 12/27/2010   Method used:   Print then Give to Patient   RxID:   [id number] SKELAXIN 800 MG TABS (METAXALONE) 1 by mouth two times a day to three times a day as needed  #15 x 0   Entered and Authorized by:   Donna Christen MD   Signed by:   Donna Christen MD on 12/27/2010   Method used:   Print then Give to Patient   RxID:   [id number] NAPROXEN 375 MG TABS (NAPROXEN) 1 by mouth q12hr pc  #20 x 0   Entered and Authorized by:   Donna Christen MD   Signed by:   Donna Christen MD on 12/27/2010   Method used:   Print then Give to Patient   RxID:   [id number]   Orders Added: 1)  Est. Patient Level III [25366]

## 2011-01-10 ENCOUNTER — Telehealth: Payer: Self-pay | Admitting: *Deleted

## 2011-01-10 NOTE — Telephone Encounter (Signed)
Pt called stating sister has been Dx w/ Hep C and would like to know if you think it is a good idea for her to get tested as well. Please advise.

## 2011-01-10 NOTE — Telephone Encounter (Signed)
Call pt: It is easy to test for .  We just order lab work.  Can do at her next visit or we can get it any time.

## 2011-01-11 NOTE — Telephone Encounter (Signed)
Pt aware of the above  

## 2011-02-15 NOTE — H&P (Signed)
NAME:  Connie May, Connie May              ACCOUNT NO.:  [id number]   MEDICAL RECORD NO.:  [id number]          PATIENT TYPE:  INP   LOCATION:  0098                         FACILITY:  [facility]   PHYSICIAN:  Angelia Mould. Derrell Lolling, M.D.DATE OF BIRTH:  21-Nov-1955   DATE OF ADMISSION:  04/03/2008  DATE OF DISCHARGE:                              HISTORY & PHYSICAL   CHIEF COMPLAINT:  Right lower quadrant pain and nausea.   HISTORY OF PRESENT ILLNESS:  This is a healthy 55 year old white female  sent to me through the courtesy of Dr. Marga Melnick in [place].   The patient states that she developed right lower quadrant and right  flank pain about 5 days ago.  This has not gone away.  She has been  constipated, although did have a bowel movement today.  She has been  nauseated every day, but has not vomited.  She had some hot and cold  feelings, but no shaking chills and no fever.  She is been voiding okay.   She saw Dr. Alwyn Ren on June 30th.  Because of her history of kidney  stones, he got a urinalysis and urine culture and started her on Cipro.  The culture really is not showing anything.  A CT scan was done today  which shows some thickening and stranding around the tip of the appendix  and was read as being suspicious for appendicitis by the radiologist.  No other abnormalities were seen on the CT scan. Spoke with Dr. Alwyn Ren  and the patient by phone.  Because she was having continuing pain, I  asked her to come to [facility] emergency room.  I have evaluated her  and I feel that there is a moderately high probability that she has  acute appendicitis.   PAST HISTORY:  1. Kidney stones treated by Dr. Etta Grandchild in the past in 1994.  2. She is being treated for depression.  3. She had an open right knee surgery for some type of patellar tendon      repair.  4. She had a kidney stone removed.   CURRENT MEDICATIONS:  1. Hormone replacement therapy.  2. Cipro.  3. Effexor.   DRUG ALLERGIES:  None  known.   SOCIAL HISTORY:  She is married, lives in [place] and has two sons.  She works as an Investment banker, corporate person.  Denies  tobacco use.  Drinks alcohol rarely.   FAMILY HISTORY:  Mother and father living and well.  They really do not  have any major medical problems.   REVIEW OF SYSTEMS:  All systems are reviewed and 15 system review of  systems is performed and is noncontributory except as described above.   PHYSICAL EXAMINATION:  Extremely pleasant middle-aged white female in no  major distress.  She is ambulatory and her husband is with her.  Temperature 98.3, pulse 96, respirations 16, blood pressure 134/69.  EYES:  Sclerae clear.  Extraocular movements are intact.  Ears, mouth,  throat, nose, lips, tongue and oropharynx without gross lesions.  NECK: Supple, nontender.  No mass.  No jugular venous distention.  LUNGS: Clear to auscultation.  No chest wall tenderness.  No  costovertebral angle tenderness.  HEART:  Regular rate and rhythm.  No murmur.  No ectopy.  Radial and  femoral pulses are palpable.  No peripheral edema.  ABDOMEN:  Soft.  She  has localized tenderness and involuntary guarding in the right lower  quadrant.  There is no back tenderness or flank tenderness.  There is no  mass.  There is no hernia.  Liver, spleen are not enlarged.  There is no  inguinal mass.  EXTREMITIES:  She moves all four extremities without pain or deformity.  NEUROLOGIC:  No gross motor or sensory deficits.  Gait is normal.   ADMISSION DATA:  White blood cell count 7700.  CT scan as described  above.   IMPRESSION:  Probable acute appendicitis.  My level of suspicion is  fairly high, although she has no leukocytosis and this has not been very  progressive.  I think that her appendicitis is being suppressed by the  Cipro and she has continued abdominal tenderness and guarding and I  think laparoscopy is required to clarify her diagnosis tonight.   PLAN:  1. I have  advised her and her husband for her to undergo general      anesthesia and diagnostic laparoscopy and if indicated laparoscopic      appendectomy.  2. I have discussed the indication and details of surgery with them      both.  Risks and complications have been outlined, including but      not limited to bleeding, infection, conversion to open laparotomy,      injury to adjacent organs such as the bladder or intestine with      major reconstructive surgery, unplanned surgery if alternative      diagnosis is found, cardiac, pulmonary thromboembolic problems.      She seems to understand these issues well.  This time all questions      are answered.  She is in full agreement this plan.      Angelia Mould. Derrell Lolling, M.D.  Electronically Signed     HMI/MEDQ  D:  04/03/2008  T:  04/03/2008  Job:  [id number]   cc:   Titus Dubin. Alwyn Ren, MD,FACP,FCCP  [phone] [place]  [place] [address]

## 2011-02-15 NOTE — Op Note (Signed)
NAME:  Connie May, Connie May              ACCOUNT NO.:  [id number]   MEDICAL RECORD NO.:  [id number]          PATIENT TYPE:  INP   LOCATION:  0098                         FAC[facility]   PHYSICIAN:  Angelia Mould. Derrell Lolling, M.D.DATE OF BIRTH:  December 19, 1955   DATE OF PROCEDURE:  04/03/2008  DATE OF DISCHARGE:                               OPERATIVE REPORT   PREOPERATIVE DIAGNOSIS:  Acute appendicitis.   POSTOPERATIVE DIAGNOSIS:  Acute appendicitis.   OPERATION PERFORMED:  Laparoscopic appendectomy.   SURGEON:  Dr. Claud Kelp.   OPERATIVE INDICATIONS:  This is a 55 year old white female who has had  right lower quadrant pain for 5 days.  She has been nauseated every day  but has not vomiting.  She was started on Cipro 48 hours ago for  presumed urinary tract infection, but her urine culture is negative.  CT  scan was done today because of persistent pain, and the only abnormal  finding was slight inflammation around the distal half of the appendix.  The radiologist said they were suspicious that this was appendicitis.  I  saw the patient in the emergency room, and I found that she had  localized tenderness and involuntary guarding in the right lower  quadrant, although her white blood cell count was normal.  I felt that  it was likely that she had appendicitis given the history and physical  findings and CT scan, and laparoscopy was advised.   OPERATIVE FINDINGS:  The distal half of the appendix was a little bit  swollen, indurated, clearly injected, and I believe inflamed.  There was  no purulence.  There was no gangrene.  There was no sign of rupture.  The uterus and both ovaries looked normal.  The last 3 or 4 feet of  terminal ileum and right colon were completely normal.  The liver and  gallbladder looked normal.  There was no intra-abdominal fluid.   OPERATIVE TECHNIQUE:  Following the induction of general endotracheal  anesthesia, the patient's abdomen was prepped and draped in a  sterile  fashion.  The patient was identified as the correct patient and correct  procedure.  Intravenous antibiotics were given prior to the incision.  Marcaine 0.5% with epinephrine was used as local infiltration  anesthetic.  A curved transverse incision was made at the superior rim  of the umbilicus.  The fascia was incised in the midline.  The abdominal  cavity entered under direct vision.  A 11-mm Hassan trocar was inserted  and secured with a pursestring suture of 0 Vicryl.  Pneumoperitoneum was  created.  Video camera was inserted with visualization and findings as  described above.  A 5-mm trocar was placed in the lower midline and a 12-  mm trocar placed in the left suprapubic area at the hairline.  The  patient was positioned.  The appendix was visualized.  I explored the  abdomen, looking at the liver and gallbladder and transverse colon, all  of which looked normal.  The right colon and terminal ileum looked  normal.  There was no evidence of Meckel's diverticulum and no evidence  of Crohn's  disease.  The uterus and both ovaries and tubes looked  normal.   The appendiceal mesentery was taken down with the harmonic scalpel.  I  did this in several steps until I had skeletonized it completely and  could clearly identify the junction of the appendix with cecum.  An Endo-  GIA stapling device was inserted and then placed transversely across the  base the appendix at the level of cecum.  The stapler was closed, held  in place for 3 seconds, and then fired and removed.  The appendix was  placed in the specimen bag and removed and sent to pathology.  The  operative field was copiously irrigated.  There was a little bit of the  oozing from the raw areas of the mesentery, but this was very short in  duration.  I placed a piece of Surgicel in place for about 1 minute, and  it was completely hemostatic.   We irrigated one more time.  Everything looked perfectly clean.  Trocars  were  removed under direct vision.  There was no bleeding from trocar  sites.  The fascia at the umbilicus was closed with 0 Vicryl sutures.  The wounds were irrigated with saline and skin incisions closed with  subcuticular sutures of 4-0 Monocryl and Dermabond.  The patient was  taken to the recovery room in stable condition.  Estimated blood loss  was about 10-20 mL.  Complications none.  Sponge, needle and instrument  counts were correct.      Angelia Mould. Derrell Lolling, M.D.  Electronically Signed     HMI/MEDQ  D:  04/03/2008  T:  04/03/2008  Job:  [id number]

## 2011-05-07 ENCOUNTER — Encounter: Payer: Self-pay | Admitting: Family Medicine

## 2011-05-20 ENCOUNTER — Encounter: Payer: 59 | Admitting: Family Medicine

## 2011-06-30 LAB — DIFFERENTIAL
Eosinophils Relative: 2
Lymphocytes Relative: 32
Lymphs Abs: 2.5
Monocytes Absolute: 0.6
Monocytes Relative: 8

## 2011-06-30 LAB — COMPREHENSIVE METABOLIC PANEL
AST: 19
Albumin: 3.6
Calcium: 9.1
Creatinine, Ser: 0.7
GFR calc Af Amer: >60
Sodium: 139

## 2011-06-30 LAB — CBC
HCT: 37.1
Hemoglobin: 12.4
MCV: 90.1
Platelets: 240
WBC: 7.7

## 2011-11-01 ENCOUNTER — Telehealth: Payer: Self-pay | Admitting: Family Medicine

## 2011-11-01 ENCOUNTER — Other Ambulatory Visit: Payer: Self-pay | Admitting: Family Medicine

## 2011-11-01 DIAGNOSIS — Z1231 Encounter for screening mammogram for malignant neoplasm of breast: Secondary | ICD-10-CM

## 2011-11-01 NOTE — Telephone Encounter (Signed)
Left message on pt.'s vm.

## 2011-11-01 NOTE — Telephone Encounter (Signed)
That has to be arranged through [facility]. i can put the order in but they will have to arrange.

## 2011-11-01 NOTE — Telephone Encounter (Signed)
Patient came in schedule appt for cpe on 11/15/11 at 11:00am but request to have a mammogram done that same day earlier that morning.

## 2011-11-15 ENCOUNTER — Encounter: Payer: Self-pay | Admitting: Family Medicine

## 2011-11-15 ENCOUNTER — Ambulatory Visit (INDEPENDENT_AMBULATORY_CARE_PROVIDER_SITE_OTHER): Payer: 59 | Admitting: Family Medicine

## 2011-11-15 ENCOUNTER — Ambulatory Visit
Admission: RE | Admit: 2011-11-15 | Discharge: 2011-11-15 | Disposition: A | Discharge status: Home / Self Care | Payer: 59 | Source: Ambulatory Visit | Attending: Family Medicine | Admitting: Family Medicine

## 2011-11-15 ENCOUNTER — Other Ambulatory Visit (HOSPITAL_COMMUNITY)
Admission: RE | Admit: 2011-11-15 | Discharge: 2011-11-15 | Disposition: A | Discharge status: Home / Self Care | Payer: 59 | Source: Ambulatory Visit | Attending: Family Medicine | Admitting: Family Medicine

## 2011-11-15 VITALS — BP 104/57 | HR 79 | Ht 61.0 in | Wt 145.0 lb

## 2011-11-15 DIAGNOSIS — Z789 Other specified health status: Secondary | ICD-10-CM

## 2011-11-15 DIAGNOSIS — Z1159 Encounter for screening for other viral diseases: Secondary | ICD-10-CM | POA: Insufficient documentation

## 2011-11-15 DIAGNOSIS — Z01419 Encounter for gynecological examination (general) (routine) without abnormal findings: Secondary | ICD-10-CM | POA: Insufficient documentation

## 2011-11-15 DIAGNOSIS — Z1231 Encounter for screening mammogram for malignant neoplasm of breast: Secondary | ICD-10-CM

## 2011-11-15 DIAGNOSIS — Z23 Encounter for immunization: Secondary | ICD-10-CM

## 2011-11-15 MED ORDER — SUMATRIPTAN SUCCINATE 100 MG PO TABS: 100.0000 mg | ORAL_TABLET | ORAL | Status: DC | PRN

## 2011-11-15 NOTE — Patient Instructions (Addendum)
Start a regular exercise program and make sure you are eating a healthy diet Try to eat 4 servings of dairy a day or take a calcium supplement (500mg twice a day). Your vaccines are up to date.  We will call you with your lab results. If you don't here from us in about a week then please give us a call at [phone].  

## 2011-11-15 NOTE — Progress Notes (Signed)
  Subjective:     Connie May is a 56 y.o. female and is here for a comprehensive physical exam. The patient reports no problems.  History   Social History  . Marital Status: Married    Spouse Name: N/A    Number of Children: N/A  . Years of Education: N/A   Occupational History  . Not on file.   Social History Main Topics  . Smoking status: Never Smoker   . Smokeless tobacco: Not on file  . Alcohol Use: Yes  . Drug Use: No  . Sexually Active:    Other Topics Concern  . Not on file   Social History Narrative  . No narrative on file   Health Maintenance  Topic Date Due  . Influenza Vaccine  07/03/2012  . Mammogram  08/03/2012  . Pap Smear  11/14/2014  . Colonoscopy  10/04/2015  . Tetanus/tdap  11/14/2021    The following portions of the patient's history were reviewed and updated as appropriate: allergies, current medications, past family history, past medical history, past social history, past surgical history and problem list.  Review of Systems A comprehensive review of systems was negative.   Objective:    BP 104/57  Pulse 79  Ht 5\' 1"  (1.549 m)  Wt 145 lb (65.772 kg)  BMI 27.40 kg/m2 General appearance: alert, cooperative and appears stated age Head: Normocephalic, without obvious abnormality, atraumatic Eyes: conj clear, EOMi, PEERLA Ears: normal TM's and external ear canals both ears Nose: Nares normal. Septum midline. Mucosa normal. No drainage or sinus tenderness. Throat: lips, mucosa, and tongue normal; teeth and gums normal Neck: no adenopathy, no carotid bruit, no JVD, supple, symmetrical, trachea midline and thyroid not enlarged, symmetric, no tenderness/mass/nodules Back: symmetric, no curvature. ROM normal. No CVA tenderness. Lungs: clear to auscultation bilaterally Breasts: normal appearance, no masses or tenderness Heart: regular rate and rhythm, S1, S2 normal, no murmur, click, rub or gallop Abdomen: soft, non-tender; bowel sounds normal; no  masses,  no organomegaly Pelvic: cervix normal in appearance, external genitalia normal, no adnexal masses or tenderness, no cervical motion tenderness, rectovaginal septum normal, uterus normal size, shape, and consistency and vagina normal without discharge Extremities: extremities normal, atraumatic, no cyanosis or edema Pulses: 2+ and symmetric Skin: Skin color, texture, turgor normal. No rashes or lesions Lymph nodes: Cervical, supraclavicular, and axillary nodes normal. Neurologic: Grossly normal    Assessment:    Healthy female exam.      Plan:     See After Visit Summary for Counseling Recommendations  Start a regular exercise program and make sure you are eating a healthy diet Try to eat 4 servings of dairy a day or take a calcium supplement (500mg  twice a day). Your vaccines are up to date.  Due for screening labs.  Hx of lwo vit D. Recheck today Also vegeterian so check for Vit B12 def.

## 2011-12-13 ENCOUNTER — Emergency Department (HOSPITAL_COMMUNITY): Payer: 59

## 2011-12-13 ENCOUNTER — Encounter (HOSPITAL_COMMUNITY): Payer: Self-pay | Admitting: *Deleted

## 2011-12-13 ENCOUNTER — Other Ambulatory Visit: Payer: Self-pay

## 2011-12-13 ENCOUNTER — Observation Stay (HOSPITAL_COMMUNITY)
Admission: EM | Admit: 2011-12-13 | Discharge: 2011-12-13 | Disposition: A | Discharge status: Home Health | Payer: 59 | Attending: Emergency Medicine | Admitting: Emergency Medicine

## 2011-12-13 DIAGNOSIS — F410 Panic disorder [episodic paroxysmal anxiety] without agoraphobia: Secondary | ICD-10-CM | POA: Insufficient documentation

## 2011-12-13 DIAGNOSIS — F3289 Other specified depressive episodes: Secondary | ICD-10-CM | POA: Insufficient documentation

## 2011-12-13 DIAGNOSIS — Z87442 Personal history of urinary calculi: Secondary | ICD-10-CM | POA: Insufficient documentation

## 2011-12-13 DIAGNOSIS — M542 Cervicalgia: Principal | ICD-10-CM | POA: Insufficient documentation

## 2011-12-13 DIAGNOSIS — M5412 Radiculopathy, cervical region: Secondary | ICD-10-CM

## 2011-12-13 DIAGNOSIS — Z79899 Other long term (current) drug therapy: Secondary | ICD-10-CM | POA: Insufficient documentation

## 2011-12-13 DIAGNOSIS — M79609 Pain in unspecified limb: Secondary | ICD-10-CM | POA: Insufficient documentation

## 2011-12-13 DIAGNOSIS — F329 Major depressive disorder, single episode, unspecified: Secondary | ICD-10-CM | POA: Insufficient documentation

## 2011-12-13 DIAGNOSIS — K219 Gastro-esophageal reflux disease without esophagitis: Secondary | ICD-10-CM | POA: Insufficient documentation

## 2011-12-13 HISTORY — DX: Panic disorder (episodic paroxysmal anxiety): F41.0

## 2011-12-13 HISTORY — DX: Gastro-esophageal reflux disease without esophagitis: K21.9

## 2011-12-13 LAB — BASIC METABOLIC PANEL
BUN: 6 mg/dL (ref 6–23)
Chloride: 104 mEq/L (ref 96–112)
Creatinine, Ser: 0.68 mg/dL (ref 0.50–1.10)
GFR calc Af Amer: >90 mL/min (ref >90)
GFR calc non Af Amer: >90 mL/min (ref >90)

## 2011-12-13 LAB — CBC
HCT: 43.2 % (ref 36.0–46.0)
MCH: 29.7 pg (ref 26.0–34.0)
MCHC: 33.3 g/dL (ref 30.0–36.0)
MCV: 89.1 fL (ref 78.0–100.0)
RDW: 13.1 % (ref 11.5–15.5)

## 2011-12-13 MED ORDER — HYDROCODONE-ACETAMINOPHEN 5-325 MG PO TABS: 1.0000 | ORAL_TABLET | ORAL | Status: AC | PRN

## 2011-12-13 MED ORDER — ASPIRIN 325 MG PO TABS
325.0000 mg | ORAL_TABLET | ORAL | Status: AC
  Administered 2011-12-13: 325 mg via ORAL
  Filled 2011-12-13: qty 1

## 2011-12-13 NOTE — ED Provider Notes (Signed)
History     CSN: [id number]  Arrival date & time 12/13/11  1210   First MD Initiated Contact with Patient 12/13/11 1252      Chief Complaint  Patient presents with  . Chest Pain   this is a very pleasant, 56 year old female with a known history of depression, kidney stones, panic attacks, acid reflux.  She also informs her that she does have a severe, chronic history of chronic neck and back problems. The past week. She has had increasing pain, numbness, and "weakness" down her left arm. She also has a similar pain in her left trapezius and left scapula. The left side of her neck. The pain sometimes radiates down into her left pectoralis and chest area. This is nonexertional. No nausea, vomiting. No shortness of breath, per se. However, feels sometimes that she cannot "catch her breath." She has no family history of premature heart disease. No hypertension or diabetes. Nonsmoker, nondrinker. Overall, appears very healthy female. The pain is nonexertional. She denies any focal, motor weakness. No headaches, no blurred vision or slurred speech. She had normal. Physical 2 weeks ago  (Consider location/radiation/quality/duration/timing/severity/associated sxs/prior treatment) HPI  Past Medical History  Diagnosis Date  . Depression   . Nephrolithiasis     history of  . Panic attacks   . Acid reflux     Past Surgical History  Procedure Date  . Open rt knee surgery   . Calculus retrieval     x 2   . Appendectomy     Family History  Problem Relation Age of Onset  . Bipolar disorder Sister     ]  . Stroke Mother   . Depression Father   . Prostate cancer Father   . Hepatitis Sister   . Stroke Maternal Grandmother     History  Substance Use Topics  . Smoking status: Never Smoker   . Smokeless tobacco: Not on file  . Alcohol Use: Yes    OB History    Grav Para Term Preterm Abortions TAB SAB Ect Mult Living                  Review of Systems  All other systems reviewed  and are negative.    Allergies  Hydrocodone-acetaminophen  Home Medications   Current Outpatient Rx  Name Route Sig Dispense Refill  . BC HEADACHE POWDER PO Oral Take 1 packet by mouth daily as needed. For headache    . CALCIUM CARBONATE-VITAMIN D 600-400 MG-UNIT PO CHEW Oral Chew 1 tablet by mouth daily.    Marland Kitchen CLONAZEPAM 0.5 MG PO TABS Oral Take 0.5 mg by mouth at bedtime as needed. For anxiety    . DULOXETINE HCL 60 MG PO CPEP Oral Take 60 mg by mouth daily.      . SUMATRIPTAN SUCCINATE 100 MG PO TABS Oral Take 100 mg by mouth every 2 (two) hours as needed. For severe migraine headache      BP 112/72  Pulse 100  Temp(Src) 98.3 F (36.8 C) (Oral)  Resp 20  Ht 5\' 1"  (1.549 m)  Wt 142 lb (64.411 kg)  BMI 26.83 kg/m2  SpO2 98%  Physical Exam  Nursing note and vitals reviewed. Constitutional: She is oriented to person, place, and time. She appears well-developed and well-nourished.  HENT:  Head: Normocephalic and atraumatic.  Eyes: Conjunctivae and EOM are normal. Pupils are equal, round, and reactive to light.  Neck: Neck supple.  Cardiovascular: Normal rate and regular rhythm.  Exam reveals  no gallop and no friction rub.   No murmur heard. Pulmonary/Chest: Breath sounds normal. She has no wheezes. She has no rales. She exhibits no tenderness.  Abdominal: Soft. Bowel sounds are normal. She exhibits no distension. There is no tenderness. There is no rebound and no guarding.  Musculoskeletal: Normal range of motion. She exhibits tenderness. She exhibits no edema.       Diffuse tenderness to palpation around the left paracervical musculature, left trapezius, left scapula. No redness or bony deformity.  Neurological: She is alert and oriented to person, place, and time. No cranial nerve deficit. She exhibits normal muscle tone. Coordination normal.        Grip strength equal and symmetric. Motor, strength 5 out of 5 equal and symmetric. No pronator drift. Heel-to-shin testing is  normal. Normal tone. No facial asymmetry.  Skin: Skin is warm and dry. No rash noted.  Psychiatric: She has a normal mood and affect.    ED Course  Procedures (including critical care time)   Labs Reviewed  CBC  BASIC METABOLIC PANEL  TROPONIN I   No results found.   No diagnosis found.    MDM  Patient is seen and examined, initial history and physical is completed. Evaluation initiated   Patient seen and examined. Vital signs are normal. Will review EKG chest x-ray and labs. However, this is more likely consistent with a cervical radiculopathy. Will follow     Date: 12/13/2011  Rate: 92  Rhythm: normal sinus rhythm  QRS Axis: normal  Intervals: normal  ST/T Wave abnormalities: normal  Conduction Disutrbances: none  Narrative Interpretation: unremarkable  No sig changes from April 03, 2008  Pt can go to CDU pending remainder of diagnostic studies.        Amely Voorheis A. Patrica Duel, MD 12/13/11 1322

## 2011-12-13 NOTE — Discharge Instructions (Signed)
Take vicodin as prescribed for severe pain.   Do not drive within four hours of taking this medication (may cause drowsiness or confusion).   Take ibuprofen w/ food up to three times a day, as needed for pain.  Ice the painful areas 2-3 times a day for 15-20 minutes and avoid activities that aggravate pain.  Follow up w/ the neurosurgeon you have been referred to.  Please return to the ER if your chest pain worsens or is associated w/ difficulty breathing. Cervical Radiculopathy Cervical radiculopathy happens when a nerve in the neck is pinched or bruised by a slipped (herniated) disk or by arthritic changes in the bones of the cervical spine. This can occur due to an injury or as part of the normal aging process. Pressure on the cervical nerves can cause pain or numbness that runs from your neck all the way down into your arm and fingers. CAUSES  There are many possible causes, including:  Injury.   Muscle tightness in the neck from overuse.   Swollen, painful joints (arthritis).   Breakdown or degeneration in the bones and joints of the spine (spondylosis) due to aging.   Bone spurs that may develop near the cervical nerves.  SYMPTOMS  Symptoms include pain, weakness, or numbness in the affected arm and hand. Pain can be severe or irritating. Symptoms may be worse when extending or turning the neck. DIAGNOSIS  Your caregiver will ask about your symptoms and do a physical exam. He or she may test your strength and reflexes. X-rays, CT scans, and MRI scans may be needed in cases of injury or if the symptoms do not go away after a period of time. Electromyography (EMG) or nerve conduction testing may be done to study how your nerves and muscles are working. TREATMENT  Your caregiver may recommend certain exercises to help relieve your symptoms. Cervical radiculopathy can, and often does, get better with time and treatment. If your problems continue, treatment options may include:  Wearing a soft  collar for short periods of time.   Physical therapy to strengthen the neck muscles.   Medicines, such as nonsteroidal anti-inflammatory drugs (NSAIDs), oral corticosteroids, or spinal injections.   Surgery. Different types of surgery may be done depending on the cause of your problems.  HOME CARE INSTRUCTIONS   Put ice on the affected area.   Put ice in a plastic bag.   Place a towel between your skin and the bag.   Leave the ice on for 15 to 20 minutes, 3 to 4 times a day or as directed by your caregiver.   Use a flat pillow when you sleep.   Only take over-the-counter or prescription medicines for pain, discomfort, or fever as directed by your caregiver.   If physical therapy was prescribed, follow your caregiver's directions.   If a soft collar was prescribed, use it as directed.  SEEK IMMEDIATE MEDICAL CARE IF:   Your pain gets much worse and cannot be controlled with medicines.   You have weakness or numbness in your hand, arm, face, or leg.   You have a high fever or a stiff, rigid neck.   You lose bowel or bladder control (incontinence).   You have trouble with walking, balance, or speaking.  MAKE SURE YOU:   Understand these instructions.   Will watch your condition.   Will get help right away if you are not doing well or get worse.  Document Released: 06/14/2001 Document Revised: 09/08/2011 Document Reviewed: 05/03/2011  ExitCare Patient Information 2012 [place].

## 2011-12-13 NOTE — ED Notes (Addendum)
Patient reports she noted onset of pain in her left arm and shoulder/neck last week.  She states she is having weakness in her left arm/shoulder, and neck.  Patient states she feels like she is having to take a deep breath at times.  Patient has not been able to sleep/eat for approx 1 week.  Patient reports she has gas as well

## 2011-12-13 NOTE — ED Provider Notes (Signed)
Medical screening examination/treatment/procedure(s) were conducted as a shared visit with non-physician practitioner(s) and myself.  I personally evaluated the patient during the encounter   Arav Bannister A. Patrica Duel, MD 12/13/11 1702

## 2011-12-13 NOTE — ED Notes (Signed)
Discharge instructions reviewed with pt; verbalizes understanding.  No questions asked; no further c/o's voiced.  Pt ambulatory to lobby with husband.  NAD noted.

## 2011-12-13 NOTE — Progress Notes (Signed)
Observation review is complete. 

## 2011-12-13 NOTE — ED Provider Notes (Signed)
Pt moved to CDU for cardiac work-up.  She has chronic pain posterior neck w/ radiation to left chest and LUE.  Pain typical but worse over the past few days.  Pt concerned she may be having a heart attack.  I agree with Dr. Patrica Duel that symptoms are most likely secondary to cervical radiculopathy.  The pain in left chest is very atypical.  It is constant, non-exertional, alleviated by propping head in comfortable position on pillow as well as w/ heating pad and is not associated w/ dyspnea, diaphoresis or nausea.  No RF for ACS. EKG non-ischemic and troponin neg today.  CT neck shows cervical stenosis and mild left foraminal narrowing.  All test results discussed w/ pt and she has been reassured.  She has a prescription for 800mg  ibuprofen at home and I prescribed vicodin as well.  Recommended ice and rest as well.  Strict return precautions discussed.   Results for orders placed during the hospital encounter of 12/13/11  CBC      Component Value Range   WBC 6.6  4.0 - 10.5 (K/uL)   RBC 4.85  3.87 - 5.11 (MIL/uL)   Hemoglobin 14.4  12.0 - 15.0 (g/dL)   HCT 16.1  09.6 - 04.5 (%)   MCV 89.1  78.0 - 100.0 (fL)   MCH 29.7  26.0 - 34.0 (pg)   MCHC 33.3  30.0 - 36.0 (g/dL)   RDW 40.9  81.1 - 91.4 (%)   Platelets 243  150 - 400 (K/uL)  BASIC METABOLIC PANEL      Component Value Range   Sodium 141  135 - 145 (mEq/L)   Potassium 3.6  3.5 - 5.1 (mEq/L)   Chloride 104  96 - 112 (mEq/L)   CO2 26  19 - 32 (mEq/L)   Glucose, Bld 116 (*) 70 - 99 (mg/dL)   BUN 6  6 - 23 (mg/dL)   Creatinine, Ser 7.82  0.50 - 1.10 (mg/dL)   Calcium 95.6  8.4 - 10.5 (mg/dL)   GFR calc non Af Amer >90  >90 (mL/min)   GFR calc Af Amer >90  >90 (mL/min)  TROPONIN I      Component Value Range   Troponin I <0.30  <0.30 (ng/mL)    Dg Chest 2 View  12/13/2011  *RADIOLOGY REPORT*  Clinical Data: Chest pain  CHEST - 2 VIEW  Comparison: None.  Findings: Heart size is normal.  No pleural effusion or edema.  No airspace  consolidation.  Review of the visualized osseous structures is negative.  IMPRESSION:  1.  No active cardiopulmonary abnormalities.  Original Report Authenticated By: Rosealee Albee, M.D.   Ct Cervical Spine Wo Contrast  12/13/2011  *RADIOLOGY REPORT*  Clinical Data: Worsening neck pain.  Left arm pain and numbness.  CT CERVICAL SPINE WITHOUT CONTRAST  Technique:  Multidetector CT imaging of the cervical spine was performed. Multiplanar CT image reconstructions were also generated.  Comparison: [facility] CT of 10/19/2007  Findings: Spinal visualization through the top of T4. Prevertebral soft tissues are within normal limits.  Straightening and mild reversal of expected cervical lordosis, centered about the C4-C5 level.  Clear lung apices.  Skull base intact.  No acute fracture.  Trace C4-C5 anterolisthesis.  Minimal neural foraminal narrowing on the left at C4-C5, secondary to facet arthropathy and uncovertebral joint hypertrophy.  Similar findings at C5-C6, with mild left-sided neural foraminal narrowing secondary to primarily uncovertebral joint hypertrophy.  At C6-C7, bilateral joint hypertrophy without significant central canal  or neural foraminal narrowing.  No definite spondylosis at other cervical levels.  Sagittal reformats demonstrate degenerative disc disease at C5-C6 and C6-C7, moderate in severity.  Disc osteophyte complexes at C5- C6 and C6-C7. Facets are well-aligned.  Minimal convex left cervical/thoracic spinal curvature.  IMPRESSION:  1.  Moderate mid and lower cervical spondylosis.  Areas of apparent mild left-sided neural foraminal narrowing.  Suboptimally evaluated at CT.  If further imaging characterization is desired, cervical spine MRI should be considered. 2.  No evidence of acute fracture.  Straightening and mild reversal of the expected lordosis, centered about C4-C5.  Trace C4-C5 anterolisthesis.  Findings are most likely degenerative.  Original Report Authenticated By: Consuello Bossier,  M.D.      Arie Sabina [place], [place] 12/13/11 [phone]

## 2018-03-12 ENCOUNTER — Encounter: Payer: Self-pay | Admitting: Internal Medicine

## 2018-03-14 ENCOUNTER — Ambulatory Visit (INDEPENDENT_AMBULATORY_CARE_PROVIDER_SITE_OTHER): Payer: 59 | Admitting: Family Medicine

## 2018-03-14 ENCOUNTER — Encounter: Payer: Self-pay | Admitting: Family Medicine

## 2018-03-14 VITALS — BP 118/78 | Ht 61.0 in | Wt 152.0 lb

## 2018-03-14 DIAGNOSIS — M5412 Radiculopathy, cervical region: Secondary | ICD-10-CM | POA: Diagnosis not present

## 2018-03-14 MED ORDER — GABAPENTIN 300 MG PO CAPS: 300.0000 mg | ORAL_CAPSULE | Freq: Every day | ORAL | 1 refills | Status: DC

## 2018-03-14 NOTE — Assessment & Plan Note (Signed)
Patient is here with signs and symptoms consistent with cervical radiculopathy.  Prior CT scan from 6 years ago reviewed showing foraminal stenosis.  It is highly likely that patient has only had slightly worsening stenotic changes since that time.  No red flag symptoms today.  DTRs intact. -Cervical x-ray ordered today.  Will review with patient at follow-up -Begin gabapentin 300 mg q. Nightly >> can increase to 300 mg twice daily in 2 weeks if necessary. -PT prescription >> to work on traction and cervical stability.  Next: Strong consideration for MRI if patient's symptoms appear to be refractory to treatment, or if symptoms worsen/progress.  If MRI is obtained without red flag symptoms then consideration for epidural steroid injections (vs. surg referral) may be warranted.
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
## 2018-04-12 ENCOUNTER — Other Ambulatory Visit: Payer: Self-pay | Admitting: Internal Medicine

## 2018-04-12 DIAGNOSIS — M542 Cervicalgia: Secondary | ICD-10-CM

## 2018-04-13 ENCOUNTER — Ambulatory Visit (HOSPITAL_COMMUNITY)
Admission: RE | Admit: 2018-04-13 | Discharge: 2018-04-13 | Disposition: A | Discharge status: Home / Self Care | Payer: 59 | Source: Ambulatory Visit | Attending: Internal Medicine | Admitting: Internal Medicine

## 2018-04-13 DIAGNOSIS — M47812 Spondylosis without myelopathy or radiculopathy, cervical region: Secondary | ICD-10-CM | POA: Diagnosis not present

## 2018-04-13 DIAGNOSIS — M542 Cervicalgia: Secondary | ICD-10-CM

## 2018-04-24 ENCOUNTER — Encounter: Payer: Self-pay | Admitting: Sports Medicine

## 2018-04-24 ENCOUNTER — Ambulatory Visit (INDEPENDENT_AMBULATORY_CARE_PROVIDER_SITE_OTHER): Payer: 59 | Admitting: Sports Medicine

## 2018-04-24 VITALS — BP 104/76 | Ht 61.0 in | Wt 153.0 lb

## 2018-04-24 DIAGNOSIS — M5412 Radiculopathy, cervical region: Secondary | ICD-10-CM | POA: Diagnosis not present

## 2018-04-25 NOTE — Progress Notes (Signed)
   Subjective:    Patient ID: Connie May, female    DOB: [birth year], 62 y.o.   MRN: [id number]  HPI   Patient comes in today for follow-up. She last saw Dr. Alease Frame on June 12. Concern was raised about possible cervical radiculopathy. X-rays were ordered. Patient was started on gabapentin 300 mg daily at bedtime and referred to physical therapy. Patient's symptoms have not improved. He continues to complain of left arm "heaviness". She continues to state that her left arm feels "like a noodle". She continues to deny numbness or tingling. She does note weakness with activity. She's not sure if the gabapentin has been helpful. It has definitely made her sleep better at night. She has not started physical therapy yet. She's had symptoms for about 2 years.   Review of Systems As above    Objective:   Physical Exam  Well-developed, well-nourished. No acute distress. Awake alert and oriented 3. Vital signs reviewed  Cervical spine: Patient continues to have slightly limited cervical rotation, particularly to the right. Good flexion and extension. No tenderness along the cervical midline. No spasm. No paraspinal musculature tenderness to palpation.  Neurological exam: Patient has 5/5 strength in both upper extremites. Sensation is intact to light touch grossly. No atrophy. Reflexes are brisk and equal at the biceps, triceps, and brachial radialis tendons bilaterally.  Cervical spine x-rays are reviewed. Patient has some moderate disc space narrowing at C5-C6 and mild disc space narrowing at C6-C7. Nothing acute.      Assessment & Plan:   Chronic left arm pain-question cervical radiculopathy  I discussed the possibility of MRI versus starting formal physical therapy. Patient will check with her insurance company to see how much an MRI will cost her. She will then let me know if she would like to proceed with the MRI or start some initial physical therapy. If we proceed with MRI, I will call  her with those results when available. If patient elects to start physical therapy first then she will follow-up with me in the office approximately 4 weeks later. Patient prefers to have her imaging and PT done at [facility].

## 2018-06-13 ENCOUNTER — Ambulatory Visit: Payer: Self-pay | Admitting: Nurse Practitioner

## 2018-09-10 ENCOUNTER — Other Ambulatory Visit (HOSPITAL_COMMUNITY): Payer: Self-pay | Admitting: Internal Medicine

## 2018-09-10 DIAGNOSIS — Z1231 Encounter for screening mammogram for malignant neoplasm of breast: Secondary | ICD-10-CM

## 2018-09-10 DIAGNOSIS — Z780 Asymptomatic menopausal state: Secondary | ICD-10-CM

## 2018-10-22 LAB — COLOGUARD

## 2018-11-13 ENCOUNTER — Ambulatory Visit (INDEPENDENT_AMBULATORY_CARE_PROVIDER_SITE_OTHER): Payer: 59 | Admitting: Internal Medicine

## 2018-11-27 ENCOUNTER — Other Ambulatory Visit (HOSPITAL_COMMUNITY): Payer: Self-pay | Admitting: Internal Medicine

## 2018-11-27 ENCOUNTER — Other Ambulatory Visit: Payer: Self-pay | Admitting: Internal Medicine

## 2018-11-27 DIAGNOSIS — N200 Calculus of kidney: Secondary | ICD-10-CM

## 2018-12-03 ENCOUNTER — Ambulatory Visit (INDEPENDENT_AMBULATORY_CARE_PROVIDER_SITE_OTHER): Payer: 59 | Admitting: Internal Medicine

## 2018-12-03 ENCOUNTER — Encounter (INDEPENDENT_AMBULATORY_CARE_PROVIDER_SITE_OTHER): Payer: Self-pay | Admitting: Internal Medicine

## 2018-12-03 ENCOUNTER — Telehealth (INDEPENDENT_AMBULATORY_CARE_PROVIDER_SITE_OTHER): Payer: Self-pay | Admitting: *Deleted

## 2018-12-03 ENCOUNTER — Encounter (INDEPENDENT_AMBULATORY_CARE_PROVIDER_SITE_OTHER): Payer: Self-pay | Admitting: *Deleted

## 2018-12-03 VITALS — BP 104/69 | HR 87 | Temp 98.3°F | Ht 61.0 in | Wt 145.1 lb

## 2018-12-03 DIAGNOSIS — R195 Other fecal abnormalities: Secondary | ICD-10-CM

## 2018-12-03 MED ORDER — SUPREP BOWEL PREP KIT 17.5-3.13-1.6 GM/177ML PO SOLN: 1.0000 | Freq: Once | ORAL | 0 refills | Status: AC

## 2018-12-03 NOTE — Progress Notes (Signed)
   Subjective:    Patient ID: Connie May, female    DOB: 06-13-1956, 63 y.o.   MRN: [id number]  HPI Referred by Dr. Nevada Crane for positive cologuard. Her last colonoscopy about 12 yrs in [place] and was normal. No change in her stools. Her appetite is good. No weight loss. She does have constipation issues which is normal. Eats prunes for her constipation.  No family hx of colon cancer. Mother has hx of multiple colon polyps per patient.    Review of Systems Past Medical History:  Diagnosis Date  . Acid reflux   . Depression   . Nephrolithiasis    history of  . Panic attacks     Past Surgical History:  Procedure Laterality Date  . APPENDECTOMY    . calculus retrieval     x 2   . open RT knee surgery      Allergies  Allergen Reactions  . Hydrocodone-Acetaminophen Nausea And Vomiting    REACTION: rash    Current Outpatient Medications on File Prior to Visit  Medication Sig Dispense Refill  . Aspirin-Salicylamide-Caffeine (BC HEADACHE POWDER PO) Take 1 packet by mouth daily as needed. For headache    . buPROPion (ZYBAN) 150 MG 12 hr tablet Take 150 mg by mouth 2 (two) times daily.    . Calcium Carbonate-Vitamin D (CALCIUM 600/VITAMIN D) 600-400 MG-UNIT per chew tablet Chew 1 tablet by mouth daily.    . clonazePAM (KLONOPIN) 0.5 MG tablet Take 0.5 mg by mouth at bedtime as needed. For anxiety    . escitalopram (LEXAPRO) 20 MG tablet Take by mouth.    . esomeprazole (NEXIUM) 20 MG capsule Take 20 mg by mouth daily at 12 noon.    Marland Kitchen buPROPion (WELLBUTRIN XL) 150 MG 24 hr tablet Take by mouth.     No current facility-administered medications on file prior to visit.         Objective:   Physical Exam Blood pressure 104/69, pulse 87, temperature 98.3 F (36.8 C), height 5\' 1"  (1.549 m), weight 145 lb 1.6 oz (65.8 kg). Alert and oriented. Skin warm and dry. Oral mucosa is moist.   . Sclera anicteric, conjunctivae is pink. Thyroid not enlarged. No cervical  lymphadenopathy. Lungs clear. Heart regular rate and rhythm.  Abdomen is soft. Bowel sounds are positive. No hepatomegaly. No abdominal masses felt. No tenderness.  No edema to lower extremities.          Assessment & Plan:  + Cologuard. Colonic polyp, colonic neoplasm needs to be ruled out. The risks of bleeding, perforation and infection were reviewed with patient.

## 2018-12-03 NOTE — Telephone Encounter (Signed)
Patient needs suprep 

## 2018-12-03 NOTE — Patient Instructions (Signed)
The risks of bleeding, perforation and infection were reviewed with patient.  

## 2018-12-04 DIAGNOSIS — R195 Other fecal abnormalities: Secondary | ICD-10-CM | POA: Insufficient documentation

## 2018-12-17 ENCOUNTER — Ambulatory Visit (HOSPITAL_COMMUNITY)
Admission: RE | Admit: 2018-12-17 | Discharge: 2018-12-17 | Disposition: A | Discharge status: Home / Self Care | Payer: 59 | Attending: Internal Medicine | Admitting: Internal Medicine

## 2018-12-17 ENCOUNTER — Encounter (HOSPITAL_COMMUNITY)
Admission: RE | Disposition: A | Discharge status: Home / Self Care | Payer: Self-pay | Source: Home / Self Care | Attending: Internal Medicine

## 2018-12-17 ENCOUNTER — Other Ambulatory Visit: Payer: Self-pay

## 2018-12-17 ENCOUNTER — Encounter (HOSPITAL_COMMUNITY): Payer: Self-pay | Admitting: *Deleted

## 2018-12-17 DIAGNOSIS — K5730 Diverticulosis of large intestine without perforation or abscess without bleeding: Secondary | ICD-10-CM | POA: Diagnosis not present

## 2018-12-17 DIAGNOSIS — D125 Benign neoplasm of sigmoid colon: Secondary | ICD-10-CM | POA: Insufficient documentation

## 2018-12-17 DIAGNOSIS — Z7982 Long term (current) use of aspirin: Secondary | ICD-10-CM | POA: Diagnosis not present

## 2018-12-17 DIAGNOSIS — R195 Other fecal abnormalities: Secondary | ICD-10-CM | POA: Insufficient documentation

## 2018-12-17 DIAGNOSIS — K635 Polyp of colon: Secondary | ICD-10-CM | POA: Insufficient documentation

## 2018-12-17 DIAGNOSIS — D123 Benign neoplasm of transverse colon: Secondary | ICD-10-CM | POA: Diagnosis not present

## 2018-12-17 DIAGNOSIS — K219 Gastro-esophageal reflux disease without esophagitis: Secondary | ICD-10-CM | POA: Insufficient documentation

## 2018-12-17 DIAGNOSIS — D127 Benign neoplasm of rectosigmoid junction: Secondary | ICD-10-CM | POA: Diagnosis not present

## 2018-12-17 DIAGNOSIS — F329 Major depressive disorder, single episode, unspecified: Secondary | ICD-10-CM | POA: Diagnosis not present

## 2018-12-17 DIAGNOSIS — Z885 Allergy status to narcotic agent status: Secondary | ICD-10-CM | POA: Diagnosis not present

## 2018-12-17 DIAGNOSIS — D120 Benign neoplasm of cecum: Secondary | ICD-10-CM | POA: Insufficient documentation

## 2018-12-17 DIAGNOSIS — Z79899 Other long term (current) drug therapy: Secondary | ICD-10-CM | POA: Insufficient documentation

## 2018-12-17 HISTORY — PX: POLYPECTOMY: SHX5525

## 2018-12-17 HISTORY — PX: BIOPSY: SHX5522

## 2018-12-17 HISTORY — PX: COLONOSCOPY: SHX5424

## 2018-12-17 SURGERY — COLONOSCOPY
Anesthesia: Moderate Sedation

## 2018-12-17 MED ORDER — MIDAZOLAM HCL 5 MG/5ML IJ SOLN
INTRAMUSCULAR | Status: DC | PRN
  Administered 2018-12-17 (×4): 2 mg via INTRAVENOUS
  Administered 2018-12-17: 1 mg via INTRAVENOUS

## 2018-12-17 MED ORDER — MEPERIDINE HCL 50 MG/ML IJ SOLN
INTRAMUSCULAR | Status: AC
  Filled 2018-12-17: qty 1

## 2018-12-17 MED ORDER — MEPERIDINE HCL 50 MG/ML IJ SOLN
INTRAMUSCULAR | Status: DC | PRN
  Administered 2018-12-17: 20 mg
  Administered 2018-12-17 (×2): 25 mg

## 2018-12-17 MED ORDER — MIDAZOLAM HCL 5 MG/5ML IJ SOLN
INTRAMUSCULAR | Status: AC
  Filled 2018-12-17: qty 10

## 2018-12-17 MED ORDER — SODIUM CHLORIDE 0.9 % IV SOLN
INTRAVENOUS | Status: DC
  Administered 2018-12-17: 11:00:00 via INTRAVENOUS

## 2018-12-17 NOTE — Discharge Instructions (Signed)
No aspirin or NSAIDs for 1 week. Resume usual medications as before High-fiber diet. No driving for 24 hours. Physician will call with biopsy results.      Colonoscopy, Adult, Care After This sheet gives you information about how to care for yourself after your procedure. Your doctor may also give you more specific instructions. If you have problems or questions, call your doctor. What can I expect after the procedure? After the procedure, it is common to have:  A small amount of blood in your poop for 24 hours.  Some gas.  Mild cramping or bloating in your belly. Follow these instructions at home: General instructions  For the first 24 hours after the procedure: ? Do not drive or use machinery. ? Do not sign important documents. ? Do not drink alcohol. ? Do your daily activities more slowly than normal. ? Eat foods that are soft and easy to digest.  Take over-the-counter or prescription medicines only as told by your doctor. To help cramping and bloating:   Try walking around.  Put heat on your belly (abdomen) as told by your doctor. Use a heat source that your doctor recommends, such as a moist heat pack or a heating pad. ? Put a towel between your skin and the heat source. ? Leave the heat on for 20-30 minutes. ? Remove the heat if your skin turns bright red. This is especially important if you cannot feel pain, heat, or cold. You can get burned. Eating and drinking   Drink enough fluid to keep your pee (urine) clear or pale yellow.  Return to your normal diet as told by your doctor. Avoid heavy or fried foods that are hard to digest.  Avoid drinking alcohol for as long as told by your doctor. Contact a doctor if:  You have blood in your poop (stool) 2-3 days after the procedure. Get help right away if:  You have more than a small amount of blood in your poop.  You see large clumps of tissue (blood clots) in your poop.  Your belly is swollen.  You feel  sick to your stomach (nauseous).  You throw up (vomit).  You have a fever.  You have belly pain that gets worse, and medicine does not help your pain. Summary  After the procedure, it is common to have a small amount of blood in your poop. You may also have mild cramping and bloating in your belly.  For the first 24 hours after the procedure, do not drive or use machinery, do not sign important documents, and do not drink alcohol.  Get help right away if you have a lot of blood in your poop, feel sick to your stomach, have a fever, or have more belly pain. This information is not intended to replace advice given to you by your health care provider. Make sure you discuss any questions you have with your health care provider. Document Released: 10/22/2010 Document Revised: 07/20/2017 Document Reviewed: 06/13/2016 Elsevier Interactive Patient Education  2019 [place].     Colon Polyps  Polyps are tissue growths inside the body. Polyps can grow in many places, including the large intestine (colon). A polyp may be a round bump or a mushroom-shaped growth. You could have one polyp or several. Most colon polyps are noncancerous (benign). However, some colon polyps can become cancerous over time. Finding and removing the polyps early can help prevent this. What are the causes? The exact cause of colon polyps is not known. What  increases the risk? You are more likely to develop this condition if you:  Have a family history of colon cancer or colon polyps.  Are older than 32 or older than 45 if you are African American.  Have inflammatory bowel disease, such as ulcerative colitis or Crohn's disease.  Have certain hereditary conditions, such as: ? Familial adenomatous polyposis. ? Lynch syndrome. ? Turcot syndrome. ? Peutz-Jeghers syndrome.  Are overweight.  Smoke cigarettes.  Do not get enough exercise.  Drink too much alcohol.  Eat a diet that is high in fat and red meat  and low in fiber.  Had childhood cancer that was treated with abdominal radiation. What are the signs or symptoms? Most polyps do not cause symptoms. If you have symptoms, they may include:  Blood coming from your rectum when having a bowel movement.  Blood in your stool. The stool may look dark red or black.  Abdominal pain.  A change in bowel habits, such as constipation or diarrhea. How is this diagnosed? This condition is diagnosed with a colonoscopy. This is a procedure in which a lighted, flexible scope is inserted into the anus and then passed into the colon to examine the area. Polyps are sometimes found when a colonoscopy is done as part of routine cancer screening tests. How is this treated? Treatment for this condition involves removing any polyps that are found. Most polyps can be removed during a colonoscopy. Those polyps will then be tested for cancer. Additional treatment may be needed depending on the results of testing. Follow these instructions at home: Lifestyle  Maintain a healthy weight, or lose weight if recommended by your health care provider.  Exercise every day or as told by your health care provider.  Do not use any products that contain nicotine or tobacco, such as cigarettes and e-cigarettes. If you need help quitting, ask your health care provider.  If you drink alcohol, limit how much you have: ? 0-1 drink a day for women. ? 0-2 drinks a day for men.  Be aware of how much alcohol is in your drink. In the [place] one drink equals one 12 oz bottle of beer (355 mL), one 5 oz glass of wine (148 mL), or one 1 oz shot of hard liquor (44 mL). Eating and drinking   Eat foods that are high in fiber, such as fruits, vegetables, and whole grains.  Eat foods that are high in calcium and vitamin D, such as milk, cheese, yogurt, eggs, liver, fish, and broccoli.  Limit foods that are high in fat, such as fried foods and desserts.  Limit the amount of red meat  and processed meat you eat, such as hot dogs, sausage, bacon, and lunch meats. General instructions  Keep all follow-up visits as told by your health care provider. This is important. ? This includes having regularly scheduled colonoscopies. ? Talk to your health care provider about when you need a colonoscopy. Contact a health care provider if:  You have new or worsening bleeding during a bowel movement.  You have new or increased blood in your stool.  You have a change in bowel habits.  You lose weight for no known reason. Summary  Polyps are tissue growths inside the body. Polyps can grow in many places, including the colon.  Most colon polyps are noncancerous (benign), but some can become cancerous over time.  This condition is diagnosed with a colonoscopy.  Treatment for this condition involves removing any polyps that are found. Most  polyps can be removed during a colonoscopy. This information is not intended to replace advice given to you by your health care provider. Make sure you discuss any questions you have with your health care provider. Document Released: 06/15/2004 Document Revised: 01/04/2018 Document Reviewed: 01/04/2018 Elsevier Interactive Patient Education  2019 [employer]    High-Fiber Diet Fiber, also called dietary fiber, is a type of carbohydrate that is found in fruits, vegetables, whole grains, and beans. A high-fiber diet can have many health benefits. Your health care provider may recommend a high-fiber diet to help:  Prevent constipation. Fiber can make your bowel movements more regular.  Lower your cholesterol.  Relieve the following conditions: ? Swelling of veins in the anus (hemorrhoids). ? Swelling and irritation (inflammation) of specific areas of the digestive tract (uncomplicated diverticulosis). ? A problem of the large intestine (colon) that sometimes causes pain and diarrhea (irritable bowel syndrome, IBS).  Prevent overeating as  part of a weight-loss plan.  Prevent heart disease, type 2 diabetes, and certain cancers. What is my plan? The recommended daily fiber intake in grams (g) includes:  38 g for men age 55 or younger.  30 g for men over age 71.  40 g for women age 18 or younger.  21 g for women over age 16. You can get the recommended daily intake of dietary fiber by:  Eating a variety of fruits, vegetables, grains, and beans.  Taking a fiber supplement, if it is not possible to get enough fiber through your diet. What do I need to know about a high-fiber diet?  It is better to get fiber through food sources rather than from fiber supplements. There is not a lot of research about how effective supplements are.  Always check the fiber content on the nutrition facts label of any prepackaged food. Look for foods that contain 5 g of fiber or more per serving.  Talk with a diet and nutrition specialist (dietitian) if you have questions about specific foods that are recommended or not recommended for your medical condition, especially if those foods are not listed below.  Gradually increase how much fiber you consume. If you increase your intake of dietary fiber too quickly, you may have bloating, cramping, or gas.  Drink plenty of water. Water helps you to digest fiber. What are tips for following this plan?  Eat a wide variety of high-fiber foods.  Make sure that half of the grains that you eat each day are whole grains.  Eat breads and cereals that are made with whole-grain flour instead of refined flour or white flour.  Eat brown rice, bulgur wheat, or millet instead of white rice.  Start the day with a breakfast that is high in fiber, such as a cereal that contains 5 g of fiber or more per serving.  Use beans in place of meat in soups, salads, and pasta dishes.  Eat high-fiber snacks, such as berries, raw vegetables, nuts, and popcorn.  Choose whole fruits and vegetables instead of processed  forms like juice or sauce. What foods can I eat?  Fruits Berries. Pears. Apples. Oranges. Avocado. Prunes and raisins. Dried figs. Vegetables Sweet potatoes. Spinach. Kale. Artichokes. Cabbage. Broccoli. Cauliflower. Green peas. Carrots. Squash. Grains Whole-grain breads. Multigrain cereal. Oats and oatmeal. Brown rice. Barley. Bulgur wheat. [place]. Quinoa. Bran muffins. Popcorn. Rye wafer crackers. Meats and other proteins Navy, kidney, and pinto beans. Soybeans. Split peas. Lentils. Nuts and seeds. Dairy Fiber-fortified yogurt. Beverages Fiber-fortified soy milk. Fiber-fortified orange  juice. Other foods Fiber bars. The items listed above may not be a complete list of recommended foods and beverages. Contact a dietitian for more options. What foods are not recommended? Fruits Fruit juice. Cooked, strained fruit. Vegetables Fried potatoes. Canned vegetables. Well-cooked vegetables. Grains White bread. Pasta made with refined flour. White rice. Meats and other proteins Fatty cuts of meat. Fried chicken or fried fish. Dairy Milk. Yogurt. Cream cheese. Sour cream. Fats and oils Butters. Beverages Soft drinks. Other foods Cakes and pastries. The items listed above may not be a complete list of foods and beverages to avoid. Contact a dietitian for more information. Summary  Fiber is a type of carbohydrate. It is found in fruits, vegetables, whole grains, and beans.  There are many health benefits of eating a high-fiber diet, such as preventing constipation, lowering blood cholesterol, helping with weight loss, and reducing your risk of heart disease, diabetes, and certain cancers.  Gradually increase your intake of fiber. Increasing too fast can result in cramping, bloating, and gas. Drink plenty of water while you increase your fiber.  The best sources of fiber include whole fruits and vegetables, whole grains, nuts, seeds, and beans. This information is not intended to  replace advice given to you by your health care provider. Make sure you discuss any questions you have with your health care provider. Document Released: 09/19/2005 Document Revised: 07/24/2017 Document Reviewed: 07/24/2017 Elsevier Interactive Patient Education  2019 [employer].

## 2018-12-17 NOTE — H&P (Signed)
Connie May is an 63 y.o. female.   Chief Complaint: Patient is here for colonoscopy HPI: Patient 63 year old Caucasian female who had a normal screening colonoscopy about 12 years ago.  She decided to have Cologuard and it came back positive.  She has no symptoms.  She denies rectal bleeding abdominal pain or change in her bowel habits. Family history is negative for CRC.  Past Medical History:  Diagnosis Date  . Acid reflux   . Depression   . Nephrolithiasis    history of  . Panic attacks     Past Surgical History:  Procedure Laterality Date  . APPENDECTOMY    . calculus retrieval     x 2   . open RT knee surgery      Family History  Problem Relation Age of Onset  . Stroke Mother   . Depression Father   . Prostate cancer Father   . Bipolar disorder Sister        ]  . Hepatitis Sister   . Stroke Maternal Grandmother   . Colon cancer Neg Hx    Social History:  reports that she has never smoked. She uses smokeless tobacco. She reports previous alcohol use. She reports previous drug use. Drug: Marijuana.  Allergies:  Allergies  Allergen Reactions  . Hydrocodone-Acetaminophen Nausea And Vomiting and Rash    Medications Prior to Admission  Medication Sig Dispense Refill  . Aspirin-Salicylamide-Caffeine (BC HEADACHE POWDER PO) Take 1 packet by mouth daily as needed. For headache    . buPROPion (WELLBUTRIN XL) 150 MG 24 hr tablet Take 150 mg by mouth daily.    . calcium carbonate (OS-CAL - DOSED IN MG OF ELEMENTAL CALCIUM) 1250 (500 Ca) MG tablet Take 1 tablet by mouth daily with breakfast.    . Cholecalciferol (VITAMIN D3) 10 MCG (400 UNIT) CAPS Take 400 Units by mouth daily.    . clonazePAM (KLONOPIN) 1 MG tablet Take 1 mg by mouth at bedtime as needed for anxiety. For anxiety    . escitalopram (LEXAPRO) 20 MG tablet Take 20 mg by mouth daily.     Marland Kitchen omeprazole (PRILOSEC) 40 MG capsule Take 40 mg by mouth daily.    . phentermine (ADIPEX-P) 37.5 MG tablet Take 37.5 mg  by mouth daily before breakfast.      No results found for this or any previous visit (from the past 48 hour(s)). No results found.  ROS  Blood pressure (!) 119/48, pulse 81, temperature 98.1 F (36.7 C), temperature source Oral, resp. rate 12, height 5\' 1"  (1.549 m), weight 65.8 kg, SpO2 98 %. Physical Exam  Constitutional: She appears well-developed and well-nourished.  HENT:  Mouth/Throat: Oropharynx is clear and moist.  Eyes: Conjunctivae are normal. No scleral icterus.  Neck: No thyromegaly present.  Cardiovascular: Normal rate, regular rhythm and normal heart sounds.  No murmur heard. Respiratory: Effort normal and breath sounds normal.  GI: Soft. She exhibits no distension and no mass.  Laparoscopy scars from prior appendectomy.  Musculoskeletal:        General: No edema.  Neurological: She is alert.  Skin: Skin is warm and dry.     Assessment/Plan Positive Cologuard test Diagnostic colonoscopy  Hildred Laser, MD 12/17/2018, 11:20 AM

## 2018-12-17 NOTE — Op Note (Signed)
[facility] Patient Name: Connie May Procedure Date: 12/17/2018 11:10 AM MRN: [id number] Date of Birth: 06-27-1956 Attending MD: Hildred Laser , MD CSN: [id number] Age: 63 Admit Type: Outpatient Procedure:                Colonoscopy Indications:              Positive Cologuard test Providers:                Hildred Laser, MD, Rosina Lowenstein, RN, Gerome Sam, RN Referring MD:             Delphina Cahill, MD Medicines:                Meperidine 70 mg IV, Midazolam 9 mg IV Complications:            No immediate complications. Estimated Blood Loss:     Estimated blood loss was minimal. Procedure:                Pre-Anesthesia Assessment:                           - Prior to the procedure, a History and Physical                            was performed, and patient medications and                            allergies were reviewed. The patient's tolerance of                            previous anesthesia was also reviewed. The risks                            and benefits of the procedure and the sedation                            options and risks were discussed with the patient.                            All questions were answered, and informed consent                            was obtained. Prior Anticoagulants: The patient                            last took previous NSAID medication 2 days prior to                            the procedure. ASA Grade Assessment: II - A patient                            with mild systemic disease. After reviewing the  risks and benefits, the patient was deemed in                            satisfactory condition to undergo the procedure.                           After obtaining informed consent, the colonoscope                            was passed under direct vision. Throughout the                            procedure, the patient's blood pressure, pulse, and   oxygen saturations were monitored continuously. The                            PCF-[id number] ([id number]) was introduced through the                            anus and advanced to the the cecum, identified by                            appendiceal orifice and ileocecal valve. The                            colonoscopy was performed without difficulty. The                            patient tolerated the procedure well. The quality                            of the bowel preparation was adequate. The                            ileocecal valve, appendiceal orifice, and rectum                            were photographed. Scope In: 11:35:59 AM Scope Out: 12:04:05 PM Scope Withdrawal Time: 0 hours 20 minutes 35 seconds  Total Procedure Duration: 0 hours 28 minutes 6 seconds  Findings:      The perianal and digital rectal examinations were normal.      Three sessile polyps were found in the hepatic flexure and cecum. The       polyps were small in size. These were biopsied with a cold forceps for       histology. The pathology specimen was placed into Bottle Number 1.      A 6 mm polyp was found in the mid transverse colon. The polyp was       pedunculated. The polyp was removed with a hot snare. Resection and       retrieval were complete. The pathology specimen was placed into Bottle       Number 1.      A 6 mm polyp was found in the recto-sigmoid colon. The polyp was       sessile. The polyp was removed with a hot  snare. Resection and retrieval       were complete. The pathology specimen was placed into Bottle Number 2.      A small polyp was found in the recto-sigmoid colon. The polyp was       sessile. The polyp was removed with a cold snare. Resection was       complete, but the polyp tissue was not retrieved.      Scattered medium-mouthed diverticula were found in the sigmoid colon,       descending colon and hepatic flexure.      The retroflexed view of the distal rectum and anal verge  was normal and       showed no anal or rectal abnormalities. Impression:               - Three small polyps at the hepatic flexure and in                            the cecum. Biopsied.                           - One 6 mm polyp in the mid transverse colon,                            removed with a hot snare. Resected and retrieved.                           - One 6 mm polyp at the recto-sigmoid colon,                            removed with a hot snare. Resected and retrieved.                           - One small polyp at the recto-sigmoid colon,                            removed with a cold snare. Complete resection.                            Polyp tissue not retrieved.                           - Diverticulosis in the sigmoid colon, in the                            descending colon and at the hepatic flexure. Moderate Sedation:      Moderate (conscious) sedation was administered by the endoscopy nurse       and supervised by the endoscopist. The following parameters were       monitored: oxygen saturation, heart rate, blood pressure, CO2       capnography and response to care. Total physician intraservice time was       40 minutes. Recommendation:           - Patient has a contact number available for  emergencies. The signs and symptoms of potential                            delayed complications were discussed with the                            patient. Return to normal activities tomorrow.                            Written discharge instructions were provided to the                            patient.                           - High fiber diet today.                           - Continue present medications.                           - No aspirin, ibuprofen, naproxen, or other                            non-steroidal anti-inflammatory drugs for 7 days.                           - Await pathology results.                           - Repeat  colonoscopy is recommended. The                            colonoscopy date will be determined after pathology                            results from today's exam become available for                            review. Procedure Code(s):        --- Professional ---                           [phone], Colonoscopy, flexible; with removal of                            tumor(s), polyp(s), or other lesion(s) by snare                            technique                           83382, 59, Colonoscopy, flexible; with biopsy,                            single or multiple  12458, Moderate sedation; each additional 15                            minutes intraservice time                           99153, Moderate sedation; each additional 15                            minutes intraservice time                           G0500, Moderate sedation services provided by the                            same physician or other qualified health care                            professional performing a gastrointestinal                            endoscopic service that sedation supports,                            requiring the presence of an independent trained                            observer to assist in the monitoring of the                            patient's level of consciousness and physiological                            status; initial 15 minutes of intra-service time;                            patient age 70 years or older (additional time may                            be reported with [phone], as appropriate) Diagnosis Code(s):        --- Professional ---                           D12.3, Benign neoplasm of transverse colon (hepatic                            flexure or splenic flexure)                           D12.0, Benign neoplasm of cecum                           D12.7, Benign neoplasm of rectosigmoid junction                           R19.5, Other  fecal  abnormalities                           K57.30, Diverticulosis of large intestine without                            perforation or abscess without bleeding CPT copyright 2018 [employer]. All rights reserved. The codes documented in this report are preliminary and upon coder review may  be revised to meet current compliance requirements. Hildred Laser, MD Hildred Laser, MD 12/17/2018 12:14:50 PM This report has been signed electronically. Number of Addenda: 0

## 2018-12-19 ENCOUNTER — Encounter (HOSPITAL_COMMUNITY): Payer: Self-pay | Admitting: Internal Medicine

## 2019-03-12 ENCOUNTER — Other Ambulatory Visit: Payer: Self-pay | Admitting: Internal Medicine

## 2019-03-12 DIAGNOSIS — Z780 Asymptomatic menopausal state: Secondary | ICD-10-CM

## 2019-04-10 ENCOUNTER — Encounter (HOSPITAL_COMMUNITY): Payer: Self-pay

## 2019-04-10 ENCOUNTER — Other Ambulatory Visit: Payer: Self-pay

## 2019-04-10 ENCOUNTER — Ambulatory Visit (HOSPITAL_COMMUNITY)
Admission: RE | Admit: 2019-04-10 | Discharge: 2019-04-10 | Disposition: A | Discharge status: Home / Self Care | Payer: 59 | Source: Ambulatory Visit | Attending: Internal Medicine | Admitting: Internal Medicine

## 2019-04-10 DIAGNOSIS — Z1231 Encounter for screening mammogram for malignant neoplasm of breast: Secondary | ICD-10-CM | POA: Insufficient documentation

## 2019-04-10 DIAGNOSIS — Z780 Asymptomatic menopausal state: Secondary | ICD-10-CM | POA: Insufficient documentation

## 2020-04-29 ENCOUNTER — Other Ambulatory Visit (HOSPITAL_COMMUNITY): Payer: Self-pay | Admitting: Adult Health Nurse Practitioner

## 2020-04-29 DIAGNOSIS — Z1231 Encounter for screening mammogram for malignant neoplasm of breast: Secondary | ICD-10-CM

## 2020-05-04 ENCOUNTER — Ambulatory Visit (HOSPITAL_COMMUNITY)
Admission: RE | Admit: 2020-05-04 | Discharge: 2020-05-04 | Disposition: A | Discharge status: Home / Self Care | Payer: 59 | Source: Ambulatory Visit | Attending: Adult Health Nurse Practitioner | Admitting: Adult Health Nurse Practitioner

## 2020-05-04 ENCOUNTER — Other Ambulatory Visit: Payer: Self-pay

## 2020-05-04 DIAGNOSIS — Z1231 Encounter for screening mammogram for malignant neoplasm of breast: Secondary | ICD-10-CM | POA: Insufficient documentation

## 2021-03-15 DIAGNOSIS — U071 COVID-19: Secondary | ICD-10-CM | POA: Insufficient documentation

## 2021-06-09 DIAGNOSIS — F411 Generalized anxiety disorder: Secondary | ICD-10-CM | POA: Insufficient documentation

## 2021-06-30 DIAGNOSIS — Z0001 Encounter for general adult medical examination with abnormal findings: Secondary | ICD-10-CM | POA: Diagnosis not present

## 2021-06-30 DIAGNOSIS — Z0000 Encounter for general adult medical examination without abnormal findings: Secondary | ICD-10-CM | POA: Diagnosis not present

## 2021-08-17 DIAGNOSIS — Z0001 Encounter for general adult medical examination with abnormal findings: Secondary | ICD-10-CM | POA: Diagnosis not present

## 2022-01-14 ENCOUNTER — Other Ambulatory Visit (HOSPITAL_COMMUNITY): Payer: Self-pay | Admitting: Internal Medicine

## 2022-01-14 DIAGNOSIS — Z1231 Encounter for screening mammogram for malignant neoplasm of breast: Secondary | ICD-10-CM

## 2022-01-20 ENCOUNTER — Ambulatory Visit (HOSPITAL_COMMUNITY)
Admission: RE | Admit: 2022-01-20 | Discharge: 2022-01-20 | Disposition: A | Discharge status: Home / Self Care | Payer: Medicare Other | Source: Ambulatory Visit | Attending: Internal Medicine | Admitting: Internal Medicine

## 2022-01-20 DIAGNOSIS — Z1231 Encounter for screening mammogram for malignant neoplasm of breast: Secondary | ICD-10-CM

## 2022-01-26 ENCOUNTER — Other Ambulatory Visit (HOSPITAL_COMMUNITY): Payer: Self-pay | Admitting: Internal Medicine

## 2022-01-26 DIAGNOSIS — R928 Other abnormal and inconclusive findings on diagnostic imaging of breast: Secondary | ICD-10-CM

## 2022-02-09 DIAGNOSIS — E785 Hyperlipidemia, unspecified: Secondary | ICD-10-CM | POA: Diagnosis not present

## 2022-02-16 DIAGNOSIS — E785 Hyperlipidemia, unspecified: Secondary | ICD-10-CM | POA: Diagnosis not present

## 2022-02-16 DIAGNOSIS — G43909 Migraine, unspecified, not intractable, without status migrainosus: Secondary | ICD-10-CM | POA: Diagnosis not present

## 2022-02-17 ENCOUNTER — Encounter (HOSPITAL_COMMUNITY): Payer: Self-pay

## 2022-02-17 ENCOUNTER — Ambulatory Visit (HOSPITAL_COMMUNITY)
Admission: RE | Admit: 2022-02-17 | Discharge: 2022-02-17 | Disposition: A | Discharge status: Home / Self Care | Payer: Medicare Other | Source: Ambulatory Visit | Attending: Internal Medicine | Admitting: Internal Medicine

## 2022-02-17 DIAGNOSIS — R928 Other abnormal and inconclusive findings on diagnostic imaging of breast: Secondary | ICD-10-CM | POA: Insufficient documentation

## 2022-02-17 DIAGNOSIS — R922 Inconclusive mammogram: Secondary | ICD-10-CM | POA: Diagnosis not present

## 2022-02-17 DIAGNOSIS — N6489 Other specified disorders of breast: Secondary | ICD-10-CM | POA: Diagnosis not present

## 2022-02-18 ENCOUNTER — Other Ambulatory Visit (HOSPITAL_COMMUNITY): Payer: Self-pay | Admitting: Internal Medicine

## 2022-02-18 ENCOUNTER — Other Ambulatory Visit: Payer: Self-pay | Admitting: Internal Medicine

## 2022-02-18 DIAGNOSIS — R928 Other abnormal and inconclusive findings on diagnostic imaging of breast: Secondary | ICD-10-CM

## 2022-02-24 ENCOUNTER — Ambulatory Visit
Admission: RE | Admit: 2022-02-24 | Discharge: 2022-02-24 | Disposition: A | Discharge status: Home / Self Care | Payer: Medicare Other | Source: Ambulatory Visit | Attending: Internal Medicine | Admitting: Internal Medicine

## 2022-02-24 DIAGNOSIS — R928 Other abnormal and inconclusive findings on diagnostic imaging of breast: Secondary | ICD-10-CM

## 2022-02-24 DIAGNOSIS — Z170 Estrogen receptor positive status [ER+]: Secondary | ICD-10-CM | POA: Diagnosis not present

## 2022-02-24 DIAGNOSIS — C50811 Malignant neoplasm of overlapping sites of right female breast: Secondary | ICD-10-CM | POA: Diagnosis not present

## 2022-03-01 ENCOUNTER — Ambulatory Visit: Payer: Self-pay | Admitting: Surgery

## 2022-03-01 DIAGNOSIS — C50911 Malignant neoplasm of unspecified site of right female breast: Secondary | ICD-10-CM

## 2022-03-01 DIAGNOSIS — C50411 Malignant neoplasm of upper-outer quadrant of right female breast: Secondary | ICD-10-CM | POA: Diagnosis not present

## 2022-03-04 ENCOUNTER — Other Ambulatory Visit: Payer: Self-pay | Admitting: Surgery

## 2022-03-04 DIAGNOSIS — C50911 Malignant neoplasm of unspecified site of right female breast: Secondary | ICD-10-CM

## 2022-03-08 ENCOUNTER — Telehealth: Payer: Self-pay | Admitting: Radiation Oncology

## 2022-03-08 NOTE — Telephone Encounter (Signed)
LVM for pt to schedule consultation with Dr. Sondra Come.

## 2022-03-09 ENCOUNTER — Telehealth: Payer: Self-pay | Admitting: Hematology and Oncology

## 2022-03-09 NOTE — Telephone Encounter (Signed)
Scheduled appt per 6/6 referral. Pt is aware of appt date and time. Pt is aware to arrive 15 mins prior to appt time and to bring and updated insurance card. Pt is aware of appt location.

## 2022-03-17 ENCOUNTER — Ambulatory Visit: Payer: Medicare Other

## 2022-03-17 ENCOUNTER — Ambulatory Visit: Payer: Medicare Other | Admitting: Radiation Oncology

## 2022-03-24 NOTE — Progress Notes (Incomplete)
Location of Breast Cancer:  Malignant neoplasm of upper-outer quadrant of right female breast, estrogen receptor positive  Histology per Pathology Report:  (Definitive pathology pending upcoming lumpectomy) 02/24/2022 Breast, right, needle core biopsy, 9:00, 4 cmfn - INVASIVE WELL DIFFERENTIATED DUCTAL ADENOCARCINOMA, GRADE 1 (2+2+1), MEASURING 4 MM IN GREATEST EXTENT - FOCAL INTERMEDIATE GRADE DUCTAL CARCINOMA IN SITU, CRIBRIFORM TYPE WITHOUT NECROSIS  Receptor Status: ER(100%), PR (0%), Her2-neu (Negative), Ki-67(10%)  Did patient present with symptoms (if so, please note symptoms) or was this found on screening mammography?: Patient underwent screening mammogram and a density was noted right breast upper outer quadrant  Past/Anticipated interventions by surgeon, if any:  04/28/2022 --Dr. Marcello Moores Cornett  Scheduled for: RIGHT BREAST LUMPECTOMY WITH RADIOACTIVE SEED AND SENTINEL LYMPH NODE BIOPSY  03/01/2022 --Dr. Erroll Luna (office visit) Discussed breast conserving surgery versus mastectomy and reconstruction.  She is opted for right breast seed localized lumpectomy with right axillary sentinel lymph node mapping.  Discussed the procedure as well as seed use and the use of the dye and possible staining characteristics of the dye which is rare.  Refer to medical and radiation oncology.   Past/Anticipated interventions by medical oncology, if any:  Scheduled for consultation with Dr. Benay Pike later this morning  Lymphedema issues, if any:  ***    Pain issues, if any:  ***   SAFETY ISSUES: Prior radiation? *** Pacemaker/ICD? *** Possible current pregnancy? No--postmenopausal Is the patient on methotrexate? ***  Current Complaints / other details:  ***

## 2022-03-24 NOTE — Progress Notes (Incomplete)
Radiation Oncology         [phone] ________________________________  Initial Outpatient Consultation  Name: Connie May MRN: [id number]  Date: 03/25/2022  DOB: 1956-07-01  [id number], Edwinna Areola, MD  Erroll Luna, MD   REFERRING PHYSICIAN: Erroll Luna, MD  DIAGNOSIS: No diagnosis found.  Stage *** Right Breast UOQ, Invasive well differentiated ductal adenocarcinoma, ER+ / PR- / Her2-, Grade 1   Cancer Staging  No matching staging information was found for the patient.  CHIEF COMPLAINT: Here to discuss management of right breast cancer  HISTORY OF PRESENT ILLNESS::Connie May is a 66 y.o. female who presented with bilateral breast abnormalities on the following imaging: bilateral screening mammogram on the date of 01/20/22.  No symptoms, if any, were reported at that time.  Bilateral diagnostic mammogram and right breast ultrasound on 02/17/22 revealed a persistent distortion in the upper outer breast without definite sonographic correlate. No persistent abnormality was seen in the left breast. No abnormal appearing right axillary lymph nodes were appreciated.  Biopsy of the 9 o'clock right breast on date of 02/24/22 showed grade 1 invasive well differentiated ductal adenocarcinoma measuring 4 mm in the greatest extent with intermediate grade ductal carcinoma in-situ without necrosis .  ER status: 100% positive with strong staining intensity; PR status 0% negative; Proliferation marker Ki67 at 10%; Her2 status negative; Grade 1. No lymph nodes were examined.   Accordingly, the patient was referred to Dr. Brantley Stage on 03/01/22 to discuss further treatment options. Following discussion of the risks and benefits, the patient opted to proceed with breast conserving surgery with SLN biopsies. She is scheduled for surgery with Dr. Brantley Stage on 04/28/22.   ***  PREVIOUS RADIATION THERAPY: No  PAST MEDICAL HISTORY:  has a past medical history of Acid reflux, Depression,  Nephrolithiasis, and Panic attacks.    PAST SURGICAL HISTORY: Past Surgical History:  Procedure Laterality Date   APPENDECTOMY     BIOPSY  12/17/2018   Procedure: BIOPSY;  Surgeon: Rogene Houston, MD;  Location: [facility];  Service: Endoscopy;;   calculus retrieval     x 2    COLONOSCOPY N/A 12/17/2018   Procedure: COLONOSCOPY;  Surgeon: Rogene Houston, MD;  Location: [facility];  Service: Endoscopy;  Laterality: N/A;   open RT knee surgery     POLYPECTOMY  12/17/2018   Procedure: POLYPECTOMY;  Surgeon: Rogene Houston, MD;  Location: [facility];  Service: Endoscopy;;    FAMILY HISTORY: family history includes Bipolar disorder in her sister; Depression in her father; Hepatitis in her sister; Prostate cancer in her father; Stroke in her maternal grandmother and mother.  SOCIAL HISTORY:  reports that she has never smoked. She uses smokeless tobacco. She reports that she does not currently use alcohol. She reports that she does not currently use drugs after having used the following drugs: Marijuana.  ALLERGIES: Hydrocodone-acetaminophen  MEDICATIONS:  Current Outpatient Medications  Medication Sig Dispense Refill   Aspirin-Salicylamide-Caffeine (BC HEADACHE POWDER PO) Take 1 packet by mouth daily as needed. For headache     buPROPion (WELLBUTRIN XL) 150 MG 24 hr tablet Take 150 mg by mouth daily.     calcium carbonate (OS-CAL - DOSED IN MG OF ELEMENTAL CALCIUM) 1250 (500 Ca) MG tablet Take 1 tablet by mouth daily with breakfast.     Cholecalciferol (VITAMIN D3) 10 MCG (400 UNIT) CAPS Take 400 Units by mouth daily.     clonazePAM (KLONOPIN) 1 MG tablet Take 1 mg by  mouth at bedtime as needed for anxiety. For anxiety     escitalopram (LEXAPRO) 20 MG tablet Take 20 mg by mouth daily.      omeprazole (PRILOSEC) 40 MG capsule Take 40 mg by mouth daily.     phentermine (ADIPEX-P) 37.5 MG tablet Take 37.5 mg by mouth daily before breakfast.     No current facility-administered  medications for this encounter.    REVIEW OF SYSTEMS: As above in HPI.   PHYSICAL EXAM:  vitals were not taken for this visit.   General: Alert and oriented, in no acute distress HEENT: Head is normocephalic. Extraocular movements are intact. Oropharynx is clear. Neck: Neck is supple, no palpable cervical or supraclavicular lymphadenopathy. Heart: Regular in rate and rhythm with no murmurs, rubs, or gallops. Chest: Clear to auscultation bilaterally, with no rhonchi, wheezes, or rales. Abdomen: Soft, nontender, nondistended, with no rigidity or guarding. Extremities: No cyanosis or edema. Lymphatics: see Neck Exam Skin: No concerning lesions. Musculoskeletal: symmetric strength and muscle tone throughout. Neurologic: Cranial nerves II through XII are grossly intact. No obvious focalities. Speech is fluent. Coordination is intact. Psychiatric: Judgment and insight are intact. Affect is appropriate. Breasts: *** . No other palpable masses appreciated in the breasts or axillae *** .    ECOG = ***  0 - Asymptomatic (Fully active, able to carry on all predisease activities without restriction)  1 - Symptomatic but completely ambulatory (Restricted in physically strenuous activity but ambulatory and able to carry out work of a light or sedentary nature. For example, light housework, office work)  2 - Symptomatic, <50% in bed during the day (Ambulatory and capable of all self care but unable to carry out any work activities. Up and about more than 50% of waking hours)  3 - Symptomatic, >50% in bed, but not bedbound (Capable of only limited self-care, confined to bed or chair 50% or more of waking hours)  4 - Bedbound (Completely disabled. Cannot carry on any self-care. Totally confined to bed or chair)  5 - Death   Eustace Pen MM, Creech RH, Tormey DC, et al. [phone]). "Toxicity and response criteria of the [facility]". [place] Oncol. 5 (6): 649-55   LABORATORY  DATA:  Lab Results  Component Value Date   WBC 6.6 12/13/2011   HGB 14.4 12/13/2011   HCT 43.2 12/13/2011   MCV 89.1 12/13/2011   PLT 243 12/13/2011   CMP     Component Value Date/Time   NA 141 12/13/2011 1258   K 3.6 12/13/2011 1258   CL 104 12/13/2011 1258   CO2 26 12/13/2011 1258   GLUCOSE 116 (H) 12/13/2011 1258   BUN 6 12/13/2011 1258   CREATININE 0.68 12/13/2011 1258   CALCIUM 10.1 12/13/2011 1258   PROT 7.5 10/11/2009 1020   ALBUMIN 3.8 10/11/2009 1020   AST 23 10/11/2009 1020   ALT 19 10/11/2009 1020   ALKPHOS 87 10/11/2009 1020   BILITOT 0.6 10/11/2009 1020   GFRNONAA >90 12/13/2011 1258   GFRAA >90 12/13/2011 1258         RADIOGRAPHY: MM RT BREAST BX W LOC DEV 1ST LESION IMAGE BX SPEC STEREO GUIDE  Addendum Date: 03/03/2022   ADDENDUM REPORT: 03/03/2022 10:23 ADDENDUM: Pathology revealed GRADE I INVASIVE WELL DIFFERENTIATED DUCTAL ADENOCARCINOMA, FOCAL INTERMEDIATE GRADE DUCTAL CARCINOMA IN SITU, CRIBRIFORM TYPE WITHOUT NECROSIS of the RIGHT breast, 9:00 o'clock, 4 cmfn, (coil clip). This was found to be concordant by Dr. Claudie Revering. Pathology results were discussed  with the patient by telephone. The patient reported doing well after the biopsy with tenderness at the site. Post biopsy instructions and care were reviewed and questions were answered. The patient was encouraged to call [facility] for any additional concerns. My direct phone number was provided. Surgical consultation has been arranged with Dr. Erroll Luna at [facility] on Mar 01, 2022. Pathology results reported by Terie Purser, RN on 03/01/2022. Electronically Signed   By: Claudie Revering M.D.   On: 03/03/2022 10:23   Result Date: 03/03/2022 CLINICAL DATA:  Persistent distortion in the upper outer right breast at recent mammography with no definite ultrasound correlate. EXAM: RIGHT BREAST STEREOTACTIC CORE NEEDLE BIOPSY COMPARISON:  None Available. FINDINGS: The  patient and I discussed the procedure of stereotactic-guided biopsy including benefits and alternatives. We discussed the high likelihood of a successful procedure. We discussed the risks of the procedure including infection, bleeding, tissue injury, clip migration, and inadequate sampling. Informed written consent was given. The usual time out protocol was performed immediately prior to the procedure. Using sterile technique and 1% Lidocaine as local anesthetic, under stereotactic guidance, a 9 gauge vacuum assisted device was used to perform core needle biopsy of recently demonstrated focal distortion in the upper-outer right breast using a cephalad approach. Lesion quadrant: Upper outer quadrant At the conclusion of the procedure, a coil shaped tissue marker clip was deployed into the biopsy cavity. Follow-up 2-view mammogram was performed and dictated separately. IMPRESSION: Stereotactic-guided biopsy of focal distortion in the upper-outer right breast. No apparent complications. Electronically Signed: By: Claudie Revering M.D. On: 02/24/2022 12:05  MM CLIP PLACEMENT RIGHT  Result Date: 02/24/2022 CLINICAL DATA:  Status post 3D stereotactic guided needle biopsy of area of focal distortion in the upper-outer right breast. EXAM: 3D DIAGNOSTIC RIGHT MAMMOGRAM POST STEREOTACTIC BIOPSY COMPARISON:  Previous exam(s). FINDINGS: 3D Mammographic images were obtained following 3D stereotactic guided biopsy of focal distortion in the upper-outer right breast. The biopsy marking clip is in expected position at the site of biopsy. This is at the anterior, inferior aspect of the biopsied area of distortion. IMPRESSION: Appropriate positioning of the coil shaped biopsy marking clip at the site of biopsy in the upper outer right breast. Final Assessment: Post Procedure Mammograms for Marker Placement Electronically Signed   By: Claudie Revering M.D.   On: 02/24/2022 12:15     IMPRESSION/PLAN: ***   It was a pleasure meeting the  patient today. We discussed the risks, benefits, and side effects of radiotherapy. I recommend radiotherapy to the *** to reduce her risk of locoregional recurrence by 2/3.  We discussed that radiation would take approximately *** weeks to complete and that I would give the patient a few weeks to heal following surgery before starting treatment planning. *** If chemotherapy were to be given, this would precede radiotherapy. We spoke about acute effects including skin irritation and fatigue as well as much less common late effects including internal organ injury or irritation. We spoke about the latest technology that is used to minimize the risk of late effects for patients undergoing radiotherapy to the breast or chest wall. No guarantees of treatment were given. The patient is enthusiastic about proceeding with treatment. I look forward to participating in the patient's care.  I will await her referral back to me for postoperative follow-up and eventual CT simulation/treatment planning.  On date of service, in total, I spent *** minutes on this encounter. Patient was seen in person.  __________________________________________   Eppie Gibson, MD  This document serves as a record of services personally performed by Eppie Gibson, MD. It was created on her behalf by Roney Mans, a trained medical scribe. The creation of this record is based on the scribe's personal observations and the provider's statements to them. This document has been checked and approved by the attending provider.

## 2022-03-25 ENCOUNTER — Encounter: Payer: Self-pay | Admitting: Hematology and Oncology

## 2022-03-25 ENCOUNTER — Ambulatory Visit
Admission: RE | Admit: 2022-03-25 | Discharge: 2022-03-25 | Disposition: A | Discharge status: Home / Self Care | Payer: Medicare Other | Source: Ambulatory Visit | Attending: Radiation Oncology | Admitting: Radiation Oncology

## 2022-03-25 ENCOUNTER — Encounter: Payer: Self-pay | Admitting: Radiation Oncology

## 2022-03-25 ENCOUNTER — Institutional Professional Consult (permissible substitution): Payer: Medicare Other | Admitting: Radiation Oncology

## 2022-03-25 ENCOUNTER — Other Ambulatory Visit: Payer: Self-pay

## 2022-03-25 ENCOUNTER — Ambulatory Visit: Payer: Medicare Other

## 2022-03-25 ENCOUNTER — Inpatient Hospital Stay: Payer: Medicare Other | Attending: Hematology and Oncology | Admitting: Hematology and Oncology

## 2022-03-25 ENCOUNTER — Inpatient Hospital Stay: Payer: Medicare Other

## 2022-03-25 VITALS — BP 115/58 | HR 69 | Temp 98.8°F | Resp 18 | Wt 145.8 lb

## 2022-03-25 DIAGNOSIS — Z8042 Family history of malignant neoplasm of prostate: Secondary | ICD-10-CM | POA: Insufficient documentation

## 2022-03-25 DIAGNOSIS — C50411 Malignant neoplasm of upper-outer quadrant of right female breast: Secondary | ICD-10-CM | POA: Insufficient documentation

## 2022-03-25 DIAGNOSIS — Z801 Family history of malignant neoplasm of trachea, bronchus and lung: Secondary | ICD-10-CM | POA: Insufficient documentation

## 2022-03-25 DIAGNOSIS — Z780 Asymptomatic menopausal state: Secondary | ICD-10-CM | POA: Diagnosis not present

## 2022-03-25 DIAGNOSIS — K219 Gastro-esophageal reflux disease without esophagitis: Secondary | ICD-10-CM | POA: Diagnosis not present

## 2022-03-25 DIAGNOSIS — Z79899 Other long term (current) drug therapy: Secondary | ICD-10-CM | POA: Insufficient documentation

## 2022-03-25 DIAGNOSIS — Z170 Estrogen receptor positive status [ER+]: Secondary | ICD-10-CM | POA: Insufficient documentation

## 2022-03-28 ENCOUNTER — Encounter: Payer: Self-pay | Admitting: *Deleted

## 2022-03-28 ENCOUNTER — Telehealth: Payer: Self-pay | Admitting: *Deleted

## 2022-04-02 DIAGNOSIS — C801 Malignant (primary) neoplasm, unspecified: Secondary | ICD-10-CM

## 2022-04-02 HISTORY — DX: Malignant (primary) neoplasm, unspecified: C80.1

## 2022-04-08 ENCOUNTER — Encounter: Payer: Self-pay | Admitting: *Deleted

## 2022-04-21 ENCOUNTER — Encounter (HOSPITAL_BASED_OUTPATIENT_CLINIC_OR_DEPARTMENT_OTHER): Payer: Self-pay | Admitting: Surgery

## 2022-04-21 ENCOUNTER — Other Ambulatory Visit: Payer: Self-pay

## 2022-04-27 ENCOUNTER — Ambulatory Visit
Admission: RE | Admit: 2022-04-27 | Discharge: 2022-04-27 | Disposition: A | Discharge status: Home / Self Care | Payer: Medicare Other | Source: Ambulatory Visit | Attending: Surgery | Admitting: Surgery

## 2022-04-27 DIAGNOSIS — C50911 Malignant neoplasm of unspecified site of right female breast: Secondary | ICD-10-CM

## 2022-04-27 NOTE — Progress Notes (Signed)

## 2022-04-28 ENCOUNTER — Ambulatory Visit (HOSPITAL_BASED_OUTPATIENT_CLINIC_OR_DEPARTMENT_OTHER)
Admission: RE | Admit: 2022-04-28 | Discharge: 2022-04-28 | Disposition: A | Discharge status: Home / Self Care | Payer: Medicare Other | Attending: Surgery | Admitting: Surgery

## 2022-04-28 ENCOUNTER — Encounter (HOSPITAL_BASED_OUTPATIENT_CLINIC_OR_DEPARTMENT_OTHER)
Admission: RE | Disposition: A | Discharge status: Home / Self Care | Payer: Self-pay | Source: Home / Self Care | Attending: Surgery

## 2022-04-28 ENCOUNTER — Ambulatory Visit (HOSPITAL_BASED_OUTPATIENT_CLINIC_OR_DEPARTMENT_OTHER): Payer: Medicare Other | Admitting: Anesthesiology

## 2022-04-28 ENCOUNTER — Other Ambulatory Visit: Payer: Self-pay

## 2022-04-28 ENCOUNTER — Ambulatory Visit
Admission: RE | Admit: 2022-04-28 | Discharge: 2022-04-28 | Disposition: A | Discharge status: Home / Self Care | Payer: Medicare Other | Source: Ambulatory Visit | Attending: Surgery | Admitting: Surgery

## 2022-04-28 ENCOUNTER — Encounter (HOSPITAL_BASED_OUTPATIENT_CLINIC_OR_DEPARTMENT_OTHER): Payer: Self-pay | Admitting: Surgery

## 2022-04-28 DIAGNOSIS — R928 Other abnormal and inconclusive findings on diagnostic imaging of breast: Secondary | ICD-10-CM | POA: Diagnosis not present

## 2022-04-28 DIAGNOSIS — K219 Gastro-esophageal reflux disease without esophagitis: Secondary | ICD-10-CM | POA: Diagnosis not present

## 2022-04-28 DIAGNOSIS — G8918 Other acute postprocedural pain: Secondary | ICD-10-CM | POA: Diagnosis not present

## 2022-04-28 DIAGNOSIS — C50911 Malignant neoplasm of unspecified site of right female breast: Secondary | ICD-10-CM | POA: Diagnosis not present

## 2022-04-28 DIAGNOSIS — N62 Hypertrophy of breast: Secondary | ICD-10-CM | POA: Diagnosis not present

## 2022-04-28 DIAGNOSIS — R920 Mammographic microcalcification found on diagnostic imaging of breast: Secondary | ICD-10-CM | POA: Diagnosis not present

## 2022-04-28 DIAGNOSIS — Z01818 Encounter for other preprocedural examination: Secondary | ICD-10-CM

## 2022-04-28 DIAGNOSIS — C50411 Malignant neoplasm of upper-outer quadrant of right female breast: Secondary | ICD-10-CM | POA: Insufficient documentation

## 2022-04-28 DIAGNOSIS — N6011 Diffuse cystic mastopathy of right breast: Secondary | ICD-10-CM | POA: Diagnosis not present

## 2022-04-28 DIAGNOSIS — Z170 Estrogen receptor positive status [ER+]: Secondary | ICD-10-CM | POA: Diagnosis not present

## 2022-04-28 HISTORY — DX: Hyperlipidemia, unspecified: E78.5

## 2022-04-28 HISTORY — DX: Headache, unspecified: R51.9

## 2022-04-28 HISTORY — PX: BREAST LUMPECTOMY WITH RADIOACTIVE SEED AND SENTINEL LYMPH NODE BIOPSY: SHX6550

## 2022-04-28 SURGERY — BREAST LUMPECTOMY WITH RADIOACTIVE SEED AND SENTINEL LYMPH NODE BIOPSY
Anesthesia: General | Site: Breast | Laterality: Right

## 2022-04-28 MED ORDER — MIDAZOLAM HCL 2 MG/2ML IJ SOLN
INTRAMUSCULAR | Status: AC
  Filled 2022-04-28: qty 2

## 2022-04-28 MED ORDER — EPHEDRINE 5 MG/ML INJ
INTRAVENOUS | Status: AC
  Filled 2022-04-28: qty 5

## 2022-04-28 MED ORDER — DEXAMETHASONE SODIUM PHOSPHATE 10 MG/ML IJ SOLN
INTRAMUSCULAR | Status: AC
Start: 2022-04-28 — End: ?
  Filled 2022-04-28: qty 1

## 2022-04-28 MED ORDER — KETOROLAC TROMETHAMINE 30 MG/ML IJ SOLN
INTRAMUSCULAR | Status: AC
Start: 2022-04-28 — End: ?
  Filled 2022-04-28: qty 1

## 2022-04-28 MED ORDER — CHLORHEXIDINE GLUCONATE CLOTH 2 % EX PADS: 6.0000 | MEDICATED_PAD | Freq: Once | CUTANEOUS | Status: DC

## 2022-04-28 MED ORDER — PROPOFOL 10 MG/ML IV BOLUS
INTRAVENOUS | Status: AC
  Filled 2022-04-28: qty 20

## 2022-04-28 MED ORDER — SODIUM CHLORIDE 0.9 % IV SOLN
INTRAVENOUS | Status: DC | PRN
  Administered 2022-04-28: 200 mL

## 2022-04-28 MED ORDER — FENTANYL CITRATE (PF) 100 MCG/2ML IJ SOLN: 25.0000 ug | INTRAMUSCULAR | Status: DC | PRN

## 2022-04-28 MED ORDER — CEFAZOLIN SODIUM-DEXTROSE 2-4 GM/100ML-% IV SOLN
2.0000 g | INTRAVENOUS | Status: AC
  Administered 2022-04-28: 2 g via INTRAVENOUS

## 2022-04-28 MED ORDER — LIDOCAINE 2% (20 MG/ML) 5 ML SYRINGE
INTRAMUSCULAR | Status: AC
Start: 2022-04-28 — End: ?
  Filled 2022-04-28: qty 5

## 2022-04-28 MED ORDER — CEFAZOLIN SODIUM-DEXTROSE 2-4 GM/100ML-% IV SOLN
INTRAVENOUS | Status: AC
Start: 2022-04-28 — End: ?
  Filled 2022-04-28: qty 100

## 2022-04-28 MED ORDER — PHENYLEPHRINE HCL (PRESSORS) 10 MG/ML IV SOLN
INTRAVENOUS | Status: DC | PRN
  Administered 2022-04-28 (×6): 80 ug via INTRAVENOUS

## 2022-04-28 MED ORDER — DEXAMETHASONE SODIUM PHOSPHATE 4 MG/ML IJ SOLN
INTRAMUSCULAR | Status: DC | PRN
  Administered 2022-04-28: 5 mg via INTRAVENOUS

## 2022-04-28 MED ORDER — SODIUM CHLORIDE 0.9 % IV SOLN
INTRAVENOUS | Status: AC
  Filled 2022-04-28: qty 10

## 2022-04-28 MED ORDER — KETOROLAC TROMETHAMINE 30 MG/ML IJ SOLN
INTRAMUSCULAR | Status: DC | PRN
  Administered 2022-04-28: 15 mg via INTRAVENOUS

## 2022-04-28 MED ORDER — MIDAZOLAM HCL 2 MG/2ML IJ SOLN
1.0000 mg | Freq: Once | INTRAMUSCULAR | Status: AC
  Administered 2022-04-28: 1 mg via INTRAVENOUS

## 2022-04-28 MED ORDER — FENTANYL CITRATE (PF) 100 MCG/2ML IJ SOLN
INTRAMUSCULAR | Status: AC
  Filled 2022-04-28: qty 2

## 2022-04-28 MED ORDER — FENTANYL CITRATE (PF) 100 MCG/2ML IJ SOLN
50.0000 ug | Freq: Once | INTRAMUSCULAR | Status: AC
  Administered 2022-04-28: 50 ug via INTRAVENOUS

## 2022-04-28 MED ORDER — ONDANSETRON HCL 4 MG/2ML IJ SOLN
INTRAMUSCULAR | Status: AC
Start: 2022-04-28 — End: ?
  Filled 2022-04-28: qty 2

## 2022-04-28 MED ORDER — ONDANSETRON HCL 4 MG/2ML IJ SOLN
INTRAMUSCULAR | Status: DC | PRN
  Administered 2022-04-28: 4 mg via INTRAVENOUS

## 2022-04-28 MED ORDER — LACTATED RINGERS IV SOLN: INTRAVENOUS | Status: DC

## 2022-04-28 MED ORDER — BUPIVACAINE-EPINEPHRINE (PF) 0.25% -1:200000 IJ SOLN
INTRAMUSCULAR | Status: DC | PRN
  Administered 2022-04-28: 20 mL via PERINEURAL

## 2022-04-28 MED ORDER — KETOROLAC TROMETHAMINE 30 MG/ML IJ SOLN: INTRAMUSCULAR | Status: DC | PRN

## 2022-04-28 MED ORDER — IBUPROFEN 800 MG PO TABS: 800.0000 mg | ORAL_TABLET | Freq: Three times a day (TID) | ORAL | 0 refills | Status: DC | PRN

## 2022-04-28 MED ORDER — OXYCODONE HCL 5 MG PO TABS: 5.0000 mg | ORAL_TABLET | Freq: Four times a day (QID) | ORAL | 0 refills | Status: DC | PRN

## 2022-04-28 MED ORDER — PROPOFOL 10 MG/ML IV BOLUS
INTRAVENOUS | Status: DC | PRN
  Administered 2022-04-28: 150 mg via INTRAVENOUS

## 2022-04-28 MED ORDER — LIDOCAINE HCL (CARDIAC) PF 100 MG/5ML IV SOSY
PREFILLED_SYRINGE | INTRAVENOUS | Status: DC | PRN
  Administered 2022-04-28: 50 mg via INTRAVENOUS

## 2022-04-28 MED ORDER — BUPIVACAINE-EPINEPHRINE (PF) 0.25% -1:200000 IJ SOLN
INTRAMUSCULAR | Status: AC
Start: 2022-04-28 — End: ?
  Filled 2022-04-28: qty 150

## 2022-04-28 MED ORDER — OXYCODONE HCL 5 MG PO TABS: 5.0000 mg | ORAL_TABLET | Freq: Once | ORAL | Status: DC | PRN

## 2022-04-28 MED ORDER — FENTANYL CITRATE (PF) 100 MCG/2ML IJ SOLN
INTRAMUSCULAR | Status: DC | PRN
  Administered 2022-04-28: 50 ug via INTRAVENOUS

## 2022-04-28 MED ORDER — OXYCODONE HCL 5 MG/5ML PO SOLN: 5.0000 mg | Freq: Once | ORAL | Status: DC | PRN

## 2022-04-28 MED ORDER — BUPIVACAINE-EPINEPHRINE (PF) 0.5% -1:200000 IJ SOLN
INTRAMUSCULAR | Status: DC | PRN
  Administered 2022-04-28: 25 mL via PERINEURAL

## 2022-04-28 MED ORDER — ONDANSETRON HCL 4 MG/2ML IJ SOLN: 4.0000 mg | Freq: Four times a day (QID) | INTRAMUSCULAR | Status: DC | PRN

## 2022-04-28 MED ORDER — MIDAZOLAM HCL 5 MG/5ML IJ SOLN
INTRAMUSCULAR | Status: DC | PRN
  Administered 2022-04-28: 2 mg via INTRAVENOUS

## 2022-04-28 MED ORDER — MAGTRACE LYMPHATIC TRACER
INTRAMUSCULAR | Status: DC | PRN
  Administered 2022-04-28: 2 mL via INTRAMUSCULAR

## 2022-04-28 MED ORDER — EPHEDRINE SULFATE (PRESSORS) 50 MG/ML IJ SOLN
INTRAMUSCULAR | Status: DC | PRN
  Administered 2022-04-28 (×2): 10 mg via INTRAVENOUS

## 2022-04-28 SURGICAL SUPPLY — 52 items
ADH SKN CLS APL DERMABOND .7 (GAUZE/BANDAGES/DRESSINGS) ×1
APL PRP STRL LF DISP 70% ISPRP (MISCELLANEOUS) ×1
APPLIER CLIP 9.375 MED OPEN (MISCELLANEOUS) ×4
APR CLP MED 9.3 20 MLT OPN (MISCELLANEOUS) ×2
BINDER BREAST LRG (GAUZE/BANDAGES/DRESSINGS) IMPLANT
BINDER BREAST MEDIUM (GAUZE/BANDAGES/DRESSINGS) IMPLANT
BINDER BREAST XLRG (GAUZE/BANDAGES/DRESSINGS) ×1 IMPLANT
BINDER BREAST XXLRG (GAUZE/BANDAGES/DRESSINGS) IMPLANT
BLADE SURG 15 STRL LF DISP TIS (BLADE) ×1 IMPLANT
BLADE SURG 15 STRL SS (BLADE) ×2
CANISTER SUC SOCK COL 7IN (MISCELLANEOUS) IMPLANT
CANISTER SUCT 1200ML W/VALVE (MISCELLANEOUS) ×2 IMPLANT
CHLORAPREP W/TINT 26 (MISCELLANEOUS) ×2 IMPLANT
CLIP APPLIE 9.375 MED OPEN (MISCELLANEOUS) ×1 IMPLANT
COVER BACK TABLE 60X90IN (DRAPES) ×2 IMPLANT
COVER MAYO STAND STRL (DRAPES) ×2 IMPLANT
COVER PROBE W GEL 5X96 (DRAPES) ×2 IMPLANT
DERMABOND ADVANCED (GAUZE/BANDAGES/DRESSINGS) ×1
DERMABOND ADVANCED .7 DNX12 (GAUZE/BANDAGES/DRESSINGS) ×1 IMPLANT
DRAPE LAPAROSCOPIC ABDOMINAL (DRAPES) ×2 IMPLANT
DRAPE UTILITY XL STRL (DRAPES) ×2 IMPLANT
ELECT COATED BLADE 2.86 ST (ELECTRODE) ×2 IMPLANT
ELECT REM PT RETURN 9FT ADLT (ELECTROSURGICAL) ×2
ELECTRODE REM PT RTRN 9FT ADLT (ELECTROSURGICAL) ×1 IMPLANT
GLOVE BIOGEL PI IND STRL 8 (GLOVE) ×1 IMPLANT
GLOVE BIOGEL PI INDICATOR 8 (GLOVE) ×1
GLOVE ECLIPSE 8.0 STRL XLNG CF (GLOVE) ×2 IMPLANT
GOWN STRL REUS W/ TWL LRG LVL3 (GOWN DISPOSABLE) ×2 IMPLANT
GOWN STRL REUS W/ TWL XL LVL3 (GOWN DISPOSABLE) ×1 IMPLANT
GOWN STRL REUS W/TWL LRG LVL3 (GOWN DISPOSABLE) ×4
GOWN STRL REUS W/TWL XL LVL3 (GOWN DISPOSABLE) ×2
HEMOSTAT ARISTA ABSORB 3G PWDR (HEMOSTASIS) ×1 IMPLANT
HEMOSTAT SNOW SURGICEL 2X4 (HEMOSTASIS) IMPLANT
KIT MARKER MARGIN INK (KITS) ×2 IMPLANT
NDL HYPO 25X1 1.5 SAFETY (NEEDLE) ×1 IMPLANT
NDL SAFETY ECLIPSE 18X1.5 (NEEDLE) IMPLANT
NEEDLE HYPO 18GX1.5 SHARP (NEEDLE)
NEEDLE HYPO 25X1 1.5 SAFETY (NEEDLE) ×2 IMPLANT
NS IRRIG 1000ML POUR BTL (IV SOLUTION) ×2 IMPLANT
PACK BASIN DAY SURGERY FS (CUSTOM PROCEDURE TRAY) ×2 IMPLANT
PENCIL SMOKE EVACUATOR (MISCELLANEOUS) ×2 IMPLANT
SLEEVE SCD COMPRESS KNEE MED (STOCKING) ×2 IMPLANT
SPIKE FLUID TRANSFER (MISCELLANEOUS) IMPLANT
SPONGE T-LAP 4X18 ~~LOC~~+RFID (SPONGE) ×2 IMPLANT
SUT MNCRL AB 4-0 PS2 18 (SUTURE) ×2 IMPLANT
SUT VICRYL 3-0 CR8 SH (SUTURE) ×2 IMPLANT
SYR CONTROL 10ML LL (SYRINGE) ×2 IMPLANT
TOWEL GREEN STERILE FF (TOWEL DISPOSABLE) ×2 IMPLANT
TRACER MAGTRACE VIAL (MISCELLANEOUS) IMPLANT
TRAY FAXITRON CT DISP (TRAY / TRAY PROCEDURE) ×2 IMPLANT
TUBE CONNECTING 20X1/4 (TUBING) ×2 IMPLANT
YANKAUER SUCT BULB TIP NO VENT (SUCTIONS) ×2 IMPLANT

## 2022-04-28 NOTE — Progress Notes (Signed)
Assisted Dr. Marcie Bal with right, pectoralis, ultrasound guided block. Side rails up, monitors on throughout procedure. See vital signs in flow sheet. Tolerated Procedure well.

## 2022-04-28 NOTE — Anesthesia Procedure Notes (Signed)
Procedure Name: LMA Insertion Date/Time: 04/28/2022 7:42 AM  Performed by: Bufford Spikes, CRNAPre-anesthesia Checklist: Patient identified, Emergency Drugs available, Suction available and Patient being monitored Patient Re-evaluated:Patient Re-evaluated prior to induction Oxygen Delivery Method: Circle system utilized Preoxygenation: Pre-oxygenation with 100% oxygen Induction Type: IV induction Ventilation: Mask ventilation without difficulty LMA: LMA inserted LMA Size: 4.0 Number of attempts: 1 Placement Confirmation: positive ETCO2 Tube secured with: Tape Dental Injury: Teeth and Oropharynx as per pre-operative assessment

## 2022-04-28 NOTE — Op Note (Signed)
Preoperative diagnosis: Stage I right breast cancer upper outer quadrant  Postoperative diagnosis: Same  Procedure: Right breast seed localized lumpectomy with right axillary sentinel lymph node mapping using mag trace  Surgeon: Connie Luna, MD  Anesthesia: General with pectoral block and 0 point 0.25% Marcaine with epinephrine    EBL: 40 cc   IV fluids: Per anesthesia record  Indications for procedure: The patient is a pleasant 66 year old female with stage I right breast cancer.  She is opted for breast conserving surgery.  We discussed lumpectomy with sentinel lymph node mapping as well as mastectomy with reconstruction.  The pros and cons of surgery were reviewed with the patient.  Complications of surgery to include bleeding, infection, injury to blood vessel, injury to nerve, injury to artery, injury to vein, injury to neighboring structures, numbness, pain, shoulder stiffness, lymphedema, cardiovascular risk, anesthesia risk of the need further treatments and/or procedures discussed with the patient.  She agreed to proceed.  Description of procedure: The patient was met in the holding area and questions were answered.  The right breast was marked as the correct site.  Films were available for review after seed placement.  She underwent block of the right breast by anesthesia.  She was then taken back to the operating room.  She was placed upon upon the OR table.  After induction of general esthesia under sterile conditions 2 cc of mag trace were injected in the subareolar plexus and massaged for 5 minutes.  She was then prepped and draped in sterile fashion and a second timeout performed.  Proper patient, site and procedure were read verified.  She received appropriate preoperative antibiotics.  Neoprobe was used identify the seed right breast upper outer quadrant.  Local anesthetic was infiltrated the skin.  A curvilinear incision was made and all tissue around the seed and clip were  excised with a grossly negative margin.  The cavity was irrigated and made hemostatic.  Clips were placed to mark it and this was closed with 3-0 Vicryl and 4-0 Monocryl.  Mag trace probe was used.  Hotspot identified in the right axilla.  A 4 cm incision was made in the right axilla.  Dissection was carried down into the level 1 contents.  The uptake was noted with the probe and the signal was followed.  There were 3 very small lymph nodes removed taken up the tracer.  She did have some small vessels or coming off the axillary vein that were controlled with clips.  There was some bleeding from one of the side branches when the clip it slid off.  This was identified and controlled with another small clip.  I was able to visualize the axillary vein which was superior to this.  There is no apparent occlusion by the clips to the x-ray vein that I could see after [name] careful inspection.  The long thoracic nerve and thoracodorsal trunks were all preserved.  These were carefully inspected.  Irrigation was used.  Local anesthetic infiltrated and Arista placed.  The cavity was then closed with a deep layer 3-0 Vicryl and 4-0 Monocryl.  Dermabond was applied.  All counts found to be correct.  Breast binder placed.  The patient was awoke extubated taken to recovery in satisfactory condition.

## 2022-04-28 NOTE — Anesthesia Preprocedure Evaluation (Signed)
Anesthesia Evaluation  Patient identified by MRN, date of birth, ID band Patient awake    Reviewed: Allergy & Precautions, H&P , NPO status , Patient's Chart, lab work & pertinent test results  Airway Mallampati: II   Neck ROM: full    Dental   Pulmonary neg pulmonary ROS,    breath sounds clear to auscultation       Cardiovascular negative cardio ROS   Rhythm:regular Rate:Normal     Neuro/Psych  Headaches, PSYCHIATRIC DISORDERS Anxiety Depression    GI/Hepatic GERD  ,  Endo/Other    Renal/GU stones     Musculoskeletal   Abdominal   Peds  Hematology   Anesthesia Other Findings   Reproductive/Obstetrics Breast CA                             Anesthesia Physical Anesthesia Plan  ASA: 2  Anesthesia Plan: General   Post-op Pain Management: Regional block*   Induction: Intravenous  PONV Risk Score and Plan: 3 and Ondansetron, Dexamethasone, Midazolam and Treatment may vary due to age or medical condition  Airway Management Planned: LMA  Additional Equipment:   Intra-op Plan:   Post-operative Plan: Extubation in OR  Informed Consent: I have reviewed the patients History and Physical, chart, labs and discussed the procedure including the risks, benefits and alternatives for the proposed anesthesia with the patient or authorized representative who has indicated his/her understanding and acceptance.     Dental advisory given  Plan Discussed with: CRNA, Anesthesiologist and Surgeon  Anesthesia Plan Comments:         Anesthesia Quick Evaluation

## 2022-04-28 NOTE — H&P (Signed)
History of Present Illness: Connie May is a 66 y.o. female who is seen today as an office consultation for evaluation of New Patient .   Patient seen for evaluation of newly diagnosed right breast cancer upper outer quadrant. The patient underwent screening mammogram and a density was noted right breast upper outer quadrant. 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 Markers are pending. Upon review of the mammogram and myself, this appears to be about a centimeter maximal diameter. No history of breast pain, nipple discharge or change in either breast. No family history of breast cancer noted.  Review of Systems: A complete review of systems was obtained from the patient. I have reviewed this information and discussed as appropriate with the patient. See HPI as well for other ROS.    Medical History: Past Medical History:  Diagnosis Date   Anxiety   GERD (gastroesophageal reflux disease)   There is no problem list on file for this patient.  Past Surgical History:  Procedure Laterality Date   APPENDECTOMY    Allergies  Allergen Reactions   Hydrocodone-Acetaminophen Nausea And Vomiting and Rash  REACTION: rash   Current Outpatient Medications on File Prior to Visit  Medication Sig Dispense Refill   albuterol 90 mcg/actuation inhaler   BABY ASPIRIN ORAL Take by mouth   buPROPion (WELLBUTRIN XL) 150 MG XL tablet Take by mouth   calcium carbonate 500 mg calcium (1,250 mg) tablet Take 1 tablet by mouth daily with breakfast   cholecalciferol, vitamin D3, 10 mcg (400 unit) Cap Take by mouth   clonazePAM (KLONOPIN) 1 MG tablet Take 1 mg by mouth 2 (two) times daily as needed   escitalopram oxalate (LEXAPRO) 20 MG tablet   EUA PAXLOVID 300 mg (150 mg x 2)-100 mg tablet TAKE BY MOUTH AS DIRECTED ON INSIDE OF PACKAGE   ibuprofen (MOTRIN) 800 MG tablet Take 800 mg by mouth 3 (three) times daily as needed   LORazepam (ATIVAN) 1 MG  tablet   omeprazole (PRILOSEC) 40 MG DR capsule TAKE 1 CAPSULE BY MOUTH ONCE DAILY AS DIRECTED   phentermine (ADIPEX-P) 37.5 mg tablet Take by mouth   rosuvastatin (CRESTOR) 5 MG tablet Take 5 mg by mouth once daily   traMADoL (ULTRAM) 50 mg tablet Take 50 mg by mouth every 6 (six) hours as needed for Pain   venlafaxine (EFFEXOR-XR) 75 MG XR capsule Take 75 mg by mouth once daily   No current facility-administered medications on file prior to visit.   Family History  Problem Relation Age of Onset   Stroke Mother   High blood pressure (Hypertension) Mother    Social History   Tobacco Use  Smoking Status Never  Smokeless Tobacco Never    Social History   Socioeconomic History   Marital status: Married  Tobacco Use   Smoking status: Never   Smokeless tobacco: Never  Substance and Sexual Activity   Alcohol use: Never   Drug use: Never   Objective:   Vitals:  03/01/22 1411  BP: 126/80  Pulse: 80  Temp: 36.8 C (98.2 F)  SpO2: 95%  Weight: 66.9 kg (147 lb 6.4 oz)  Height: 154.9 cm ('5\' 1"'$ )   Body mass index is 27.85 kg/m.  Physical Exam HENT:  Head: Normocephalic.  Eyes:  Pupils: Pupils are equal, round, and reactive to light.  Cardiovascular:  Rate and Rhythm: Normal rate.  Pulmonary:  Effort: Pulmonary effort is normal.  Chest:  Breasts: Right: Normal.  No inverted nipple, mass or nipple discharge.  Left: Normal. No inverted nipple, mass or nipple discharge.  Comments: Bruising right breast noted which is mild Musculoskeletal:  General: Normal range of motion.  Lymphadenopathy:  Upper Body:  Right upper body: No supraclavicular or axillary adenopathy.  Left upper body: No supraclavicular adenopathy.  Skin: General: Skin is warm.  Neurological:  General: No focal deficit present.  Mental Status: She is alert.  Psychiatric:  Mood and Affect: Mood normal.  Behavior: Behavior normal.     Labs, Imaging and Diagnostic Testing:  CLINICAL DATA:  66 year old female for further evaluation of possible bilateral breast asymmetries on screening mammogram.   EXAM: DIGITAL DIAGNOSTIC BILATERAL MAMMOGRAM WITH TOMOSYNTHESIS AND CAD; ULTRASOUND RIGHT BREAST LIMITED   TECHNIQUE: Bilateral digital diagnostic mammography and breast tomosynthesis was performed. The images were evaluated with computer-aided detection.; Targeted ultrasound examination of the right breast was performed   COMPARISON: Previous exam(s).   ACR Breast Density Category c: The breast tissue is heterogeneously dense, which may obscure small masses.   FINDINGS: Spot compression views of both breasts and a full field view of the LEFT breast are performed.   Persistent distortion is identified within the UPPER-OUTER RIGHT breast, middle to posterior depth.   No persistent mammographic abnormalities identified in the area of the LEFT breast screening study finding.   Targeted ultrasound of the RIGHT breast is performed, showing no definite sonographic correlate to the UPPER-OUTER RIGHT breast distortion identified mammographically. The breast tissue is heterogeneous.   No abnormal appearing RIGHT axillary lymph nodes are noted.   IMPRESSION: 1. Persistent distortion in the UPPER-OUTER RIGHT breast without definite sonographic correlate. Tissue sampling is recommended. 2. No abnormal appearing RIGHT axillary lymph nodes. 3. No persistent mammographic abnormality in the area of the LEFT breast screening study finding.   RECOMMENDATION: 3D/stereotactic guided RIGHT breast biopsy, which will be arranged.   I have discussed the findings and recommendations with the patient. If applicable, a reminder letter will be sent to the patient regarding the next appointment.   BI-RADS CATEGORY 4: Suspicious.     Electronically Signed By: Margarette Canada M.D. 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 Case is reviewed in consultation by Dr. Saralyn Pilar who concurs with the diagnosis. Diagnosis called to [facility] at [place] by Dr. Alric Seton on 02/25/2022 at 10:50 AM. Tobin Chad MD Pathologist, Electronic Signature (Case signed 02/25/2022)  Assessment and Plan:   Diagnoses and all orders for this visit:  Malignant neoplasm of upper-outer quadrant of right female breast, unspecified estrogen receptor status (CMS-HCC) - Ambulatory Referral to Oncology-Medical - Ambulatory Referral to Radiation Oncology    Discussed breast conserving surgery versus mastectomy and reconstruction. Discussed local regional recurrence with each, survivor percentages, and long-term sequela. She is opted for right breast seed localized lumpectomy with right axillary sentinel lymph node mapping. Discussed the procedure as well as seed use and the use of the dye and possible staining characteristics of the dye which is rare. Discussed the rationale for lymph node sampling, lymphedema, shoulder pain and numbness afterwards. Reviewed the complications as well as expected cosmetic outcome of the procedure as well with her. Risk of bleeding,  infection, cosmesis, pain, numbness, discoloration, the need for reexcision and/or additional surgery discussed today. Refer to medical and radiation oncology.  No follow-ups on file.  Kennieth Francois, MD

## 2022-04-28 NOTE — Anesthesia Procedure Notes (Signed)
Anesthesia Regional Block: Pectoralis block   Pre-Anesthetic Checklist: , timeout performed,  Correct Patient, Correct Site, Correct Laterality,  Correct Procedure, Correct Position, site marked,  Risks and benefits discussed,  Surgical consent,  Pre-op evaluation,  At surgeon's request and post-op pain management  Laterality: Right  Prep: chloraprep       Needles:  Injection technique: Single-shot  Needle Type: Echogenic Needle     Needle Length: 9cm  Needle Gauge: 21     Additional Needles:   Narrative:  Start time: 04/28/2022 6:55 AM End time: 04/28/2022 7:04 AM Injection made incrementally with aspirations every 5 mL.  Performed by: Personally  Anesthesiologist: Albertha Ghee, MD  Additional Notes: Pt tolerated the procedure well.

## 2022-04-28 NOTE — Transfer of Care (Signed)
Immediate Anesthesia Transfer of Care Note  Patient: Connie May  Procedure(s) Performed: RIGHT BREAST LUMPECTOMY WITH RADIOACTIVE SEED AND SENTINEL LYMPH NODE BIOPSY (Right: Breast)  Patient Location: PACU  Anesthesia Type:General and Regional  Level of Consciousness: awake, alert  and oriented  Airway & Oxygen Therapy: Patient Spontanous Breathing and Patient connected to nasal cannula oxygen  Post-op Assessment: Report given to RN and Post -op Vital signs reviewed and stable  Post vital signs: Reviewed and stable  Last Vitals:  Vitals Value Taken Time  BP 98/52 04/28/22 0936  Temp 36.4 C 04/28/22 0936  Pulse 87 04/28/22 0937  Resp 13 04/28/22 0937  SpO2 100 % 04/28/22 0937  Vitals shown include unvalidated device data.  Last Pain:  Vitals:   04/28/22 0936  TempSrc:   PainSc: 0-No pain         Complications: No notable events documented.

## 2022-04-28 NOTE — Interval H&P Note (Signed)
History and Physical Interval Note:  04/28/2022 7:18 AM  Connie May  has presented today for surgery, with the diagnosis of STAGE 1 RIGHT BREAST CANCER.  The various methods of treatment have been discussed with the patient and family. After consideration of risks, benefits and other options for treatment, the patient has consented to  Procedure(s) with comments: RIGHT BREAST LUMPECTOMY WITH RADIOACTIVE SEED AND SENTINEL LYMPH NODE BIOPSY (Right) - GEN & PEC BLOCK as a surgical intervention.  The patient's history has been reviewed, patient examined, no change in status, stable for surgery.  I have reviewed the patient's chart and labs.  Questions were answered to the patient's satisfaction.     [place]

## 2022-04-28 NOTE — Anesthesia Postprocedure Evaluation (Signed)
Anesthesia Post Note  Patient: [name]  Procedure(s) Performed: RIGHT BREAST LUMPECTOMY WITH RADIOACTIVE SEED AND SENTINEL LYMPH NODE BIOPSY (Right: Breast)     Patient location during evaluation: PACU Anesthesia Type: General Level of consciousness: awake and alert Pain management: pain level controlled Vital Signs Assessment: post-procedure vital signs reviewed and stable Respiratory status: spontaneous breathing, nonlabored ventilation, respiratory function stable and patient connected to nasal cannula oxygen Cardiovascular status: blood pressure returned to baseline and stable Postop Assessment: no apparent nausea or vomiting Anesthetic complications: no   No notable events documented.  Last Vitals:  Vitals:   04/28/22 0945 04/28/22 1009  BP: 98/74 121/66  Pulse: 83 88  Resp: 17 18  Temp:  36.6 C  SpO2: 96% 93%    Last Pain:  Vitals:   04/28/22 1009  TempSrc: Oral  PainSc: 0-No pain                 Morse Brueggemann S

## 2022-04-28 NOTE — Discharge Instructions (Addendum)
[place] Office Phone Number [phone]  BREAST BIOPSY/ PARTIAL MASTECTOMY: POST OP INSTRUCTIONS  Always review your discharge instruction sheet given to you by the facility where your surgery was performed.  IF YOU HAVE DISABILITY OR FAMILY LEAVE FORMS, YOU MUST BRING THEM TO THE OFFICE FOR PROCESSING.  DO NOT GIVE THEM TO YOUR DOCTOR.  A prescription for pain medication may be given to you upon discharge.  Take your pain medication as prescribed, if needed.  If narcotic pain medicine is not needed, then you may take acetaminophen (Tylenol) or ibuprofen (Advil) as needed. Take your usually prescribed medications unless otherwise directed If you need a refill on your pain medication, please contact your pharmacy.  They will contact our office to request authorization.  Prescriptions will not be filled after 5pm or on week-ends. You should eat very light the first 24 hours after surgery, such as soup, crackers, pudding, etc.  Resume your normal diet the day after surgery. Most patients will experience some swelling and bruising in the breast.  Ice packs and a good support bra will help.  Swelling and bruising can take several days to resolve.  It is common to experience some constipation if taking pain medication after surgery.  Increasing fluid intake and taking a stool softener will usually help or prevent this problem from occurring.  A mild laxative (Milk of Magnesia or Miralax) should be taken according to package directions if there are no bowel movements after 48 hours. Unless discharge instructions indicate otherwise, you may remove your bandages 24-48 hours after surgery, and you may shower at that time.  You may have steri-strips (small skin tapes) in place directly over the incision.  These strips should be left on the skin for 7-10 days.  If your surgeon used skin glue on the incision, you may shower in 24 hours.  The glue will flake off over the next 2-3 weeks.  Any  sutures or staples will be removed at the office during your follow-up visit. ACTIVITIES:  You may resume regular daily activities (gradually increasing) beginning the next day.  Wearing a good support bra or sports bra minimizes pain and swelling.  You may have sexual intercourse when it is comfortable. You may drive when you no longer are taking prescription pain medication, you can comfortably wear a seatbelt, and you can safely maneuver your car and apply brakes. RETURN TO WORK:  ______________________________________________________________________________________ Dennis Bast should see your doctor in the office for a follow-up appointment approximately two weeks after your surgery.  Your doctor's nurse will typically make your follow-up appointment when she calls you with your pathology report.  Expect your pathology report 2-3 business days after your surgery.  You may call to check if you do not hear from [place] after three days. OTHER INSTRUCTIONS: _______________________________________________________________________________________________ _____________________________________________________________________________________________________________________________________ _____________________________________________________________________________________________________________________________________ _____________________________________________________________________________________________________________________________________  WHEN TO CALL YOUR DOCTOR: Fever over 101.0 Nausea and/or vomiting. Extreme swelling or bruising. Continued bleeding from incision. Increased pain, redness, or drainage from the incision.  The clinic staff is available to answer your questions during regular business hours.  Please don't hesitate to call and ask to speak to one of the nurses for clinical concerns.  If you have a medical emergency, go to the nearest emergency room or call 911.  A surgeon from [facility] is always on call at the hospital.  For further questions, please visit [email or web address]     May take NSAIDS after 2:35pm, if needed.  Regional Anesthesia Blocks  1. Numbness  or the inability to move the "blocked" extremity may last from 3-48 hours after placement. The length of time depends on the medication injected and your individual response to the medication. If the numbness is not going away after 48 hours, call your surgeon.  2. The extremity that is blocked will need to be protected until the numbness is gone and the  Strength has returned. Because you cannot feel it, you will need to take extra care to avoid injury. Because it may be weak, you may have difficulty moving it or using it. You may not know what position it is in without looking at it while the block is in effect.  3. For blocks in the legs and feet, returning to weight bearing and walking needs to be done carefully. You will need to wait until the numbness is entirely gone and the strength has returned. You should be able to move your leg and foot normally before you try and bear weight or walk. You will need someone to be with you when you first try to ensure you do not fall and possibly risk injury.  4. Bruising and tenderness at the needle site are common side effects and will resolve in a few days.  5. Persistent numbness or new problems with movement should be communicated to the surgeon or the [facility] [phone] [place] [phone]).   Post Anesthesia Home Care Instructions  Activity: Get plenty of rest for the remainder of the day. A responsible individual must stay with you for 24 hours following the procedure.  For the next 24 hours, DO NOT: -Drive a car -Paediatric nurse -Drink alcoholic beverages -Take any medication unless instructed by your physician -Make any legal decisions or sign important papers.  Meals: Start with liquid foods  such as gelatin or soup. Progress to regular foods as tolerated. Avoid greasy, spicy, heavy foods. If nausea and/or vomiting occur, drink only clear liquids until the nausea and/or vomiting subsides. Call your physician if vomiting continues.  Special Instructions/Symptoms: Your throat may feel dry or sore from the anesthesia or the breathing tube placed in your throat during surgery. If this causes discomfort, gargle with warm salt water. The discomfort should disappear within 24 hours.  If you had a scopolamine patch placed behind your ear for the management of post- operative nausea and/or vomiting:  1. The medication in the patch is effective for 72 hours, after which it should be removed.  Wrap patch in a tissue and discard in the trash. Wash hands thoroughly with soap and water. 2. You may remove the patch earlier than 72 hours if you experience unpleasant side effects which may include dry mouth, dizziness or visual disturbances. 3. Avoid touching the patch. Wash your hands with soap and water after contact with the patch.

## 2022-04-29 ENCOUNTER — Encounter (HOSPITAL_BASED_OUTPATIENT_CLINIC_OR_DEPARTMENT_OTHER): Payer: Self-pay | Admitting: Surgery

## 2022-04-29 LAB — SURGICAL PATHOLOGY

## 2022-04-30 ENCOUNTER — Encounter: Payer: Self-pay | Admitting: Surgery

## 2022-05-04 ENCOUNTER — Encounter (HOSPITAL_COMMUNITY): Payer: Self-pay

## 2022-05-05 ENCOUNTER — Encounter: Payer: Self-pay | Admitting: *Deleted

## 2022-05-05 ENCOUNTER — Telehealth: Payer: Self-pay | Admitting: *Deleted

## 2022-05-05 NOTE — Telephone Encounter (Signed)
Received order for oncotype testing. Requisition faxed to pathology and [facility]

## 2022-05-11 DIAGNOSIS — Z170 Estrogen receptor positive status [ER+]: Secondary | ICD-10-CM | POA: Diagnosis not present

## 2022-05-11 DIAGNOSIS — C50311 Malignant neoplasm of lower-inner quadrant of right female breast: Secondary | ICD-10-CM | POA: Diagnosis not present

## 2022-05-12 ENCOUNTER — Other Ambulatory Visit: Payer: Self-pay | Admitting: *Deleted

## 2022-05-12 ENCOUNTER — Encounter (HOSPITAL_COMMUNITY): Payer: Self-pay

## 2022-05-13 ENCOUNTER — Other Ambulatory Visit: Payer: Self-pay | Admitting: Hematology and Oncology

## 2022-05-13 NOTE — Progress Notes (Signed)
I called to review oncotype results No answer Left a voicemail.  Ipek Westra

## 2022-05-17 ENCOUNTER — Encounter: Payer: Self-pay | Admitting: *Deleted

## 2022-05-17 ENCOUNTER — Telehealth: Payer: Self-pay | Admitting: *Deleted

## 2022-05-17 DIAGNOSIS — Z170 Estrogen receptor positive status [ER+]: Secondary | ICD-10-CM

## 2022-05-17 NOTE — Telephone Encounter (Signed)
Received oncotype results of 17/5%. Referral placed for Dr. Isidore Moos.

## 2022-05-18 ENCOUNTER — Encounter: Payer: Self-pay | Admitting: Hematology and Oncology

## 2022-05-18 ENCOUNTER — Inpatient Hospital Stay: Payer: Medicare Other | Attending: Hematology and Oncology | Admitting: Hematology and Oncology

## 2022-05-18 DIAGNOSIS — C50411 Malignant neoplasm of upper-outer quadrant of right female breast: Secondary | ICD-10-CM | POA: Insufficient documentation

## 2022-05-18 DIAGNOSIS — Z170 Estrogen receptor positive status [ER+]: Secondary | ICD-10-CM | POA: Diagnosis not present

## 2022-05-18 NOTE — Progress Notes (Signed)
[facility] CONSULT NOTE  Patient Care Team: Celene Squibb, MD as PCP - General (Internal Medicine) Gala Romney, Cristopher Estimable, MD as Consulting Physician (Gastroenterology) Rockwell Germany, RN as Oncology Nurse Navigator Mauro Kaufmann, RN as Oncology Nurse Navigator  CHIEF COMPLAINTS/PURPOSE OF CONSULTATION:  Newly diagnosed breast cancer  HISTORY OF PRESENTING ILLNESS:  Connie May 66 y.o. female is here because of recent diagnosis of right breast cancer  I reviewed her records extensively and collaborated the history with the patient.  SUMMARY OF ONCOLOGIC HISTORY: Oncology History  Malignant neoplasm of upper-outer quadrant of right breast in female, estrogen receptor positive ([facility])  01/20/2022 Mammogram   Mammogram done on April 20 showed right breast focal asymmetry requiring further evaluation of a left breast asymmetry requiring further evaluation.  Diagnostic mammogram done showed persistent distortion in the upper outer right breast without definite sonographic correlate.  No abnormal appearing right axillary lymph nodes.  No persistent mammographic abnormality in the area of the left breast screening study finding.   02/24/2022 Pathology Results   Stereotactic biopsy from the right breast at 9 o'clock position showed invasive well-differentiated ductal adenocarcinoma grade 1 measuring 4 mm in greatest extent, focal intermediate grade DCIS.  Prognostic showed ER 100% positive strong staining PR 0% negative, Ki-67 of 10%, HER2 negative   03/25/2022 Initial Diagnosis   Malignant neoplasm of upper-outer quadrant of right breast in female, estrogen receptor positive (Connie May)   03/25/2022 Cancer Staging   Staging form: Breast, AJCC 8th Edition - Clinical stage from 03/25/2022: Stage Unknown (cTX, cN0, cM0, G1, ER+, PR-, HER2-) - Signed by Benay Pike, MD on 03/25/2022 Nuclear grade: G1 Histologic grading system: 3 grade system   04/28/2022 Surgery   RIGHT BREAST,  LUMPECTOMY: pT1c, pN0, Oncotype DX Breast Recurrence Score is 17  Final pathology showed grade 2 invasive moderately differentiated adenocarcinoma with focal DCIS.  Tumor measured 1.6 x 0.7 x 0.5 cm, negative margins.  3 lymph nodes negative for carcinoma    She is here to review her Oncotype results. [name] did very well with the surgery, healing very well.  She will see Dr. Isidore Moos next week to prepare for adjuvant radiation.  She did not need much pain medication at all.  Rest of the pertinent 10 point ROS reviewed and negative  MEDICAL HISTORY:  Past Medical History:  Diagnosis Date   Acid reflux    Cancer ([place]) 04/2022   right breast IDC   Depression    Headache    Hyperlipidemia    Nephrolithiasis    history of   Panic attacks     SURGICAL HISTORY: Past Surgical History:  Procedure Laterality Date   APPENDECTOMY     BIOPSY  12/17/2018   Procedure: BIOPSY;  Surgeon: Rogene Houston, MD;  [facility];  Service: Endoscopy;;   BREAST LUMPECTOMY WITH RADIOACTIVE SEED AND SENTINEL LYMPH NODE BIOPSY Right 04/28/2022   Procedure: RIGHT BREAST LUMPECTOMY WITH RADIOACTIVE SEED AND SENTINEL LYMPH NODE BIOPSY;  Surgeon: Erroll Luna, MD;  Location: [facility];  Service: General;  Laterality: Right;  GEN & PEC BLOCK   calculus retrieval     x 2    COLONOSCOPY N/A 12/17/2018   Procedure: COLONOSCOPY;  Surgeon: Rogene Houston, MD;  [facility]: [facility];  Service: Endoscopy;  Laterality: N/A;   open RT knee surgery     POLYPECTOMY  12/17/2018   Procedure: POLYPECTOMY;  Surgeon: Rogene Houston, MD;  [facility];  Service: Endoscopy;;    SOCIAL HISTORY: Social History   Socioeconomic History   Marital status: Married    Spouse name: Not on file   Number of children: 2    Years of education: Not on file   Highest education level: Not on file  Occupational History    Employer: [employer]  Tobacco Use   Smoking status: Never    Smokeless tobacco: Never  Vaping Use   Vaping Use: Never used  Substance and Sexual Activity   Alcohol use: Not Currently   Drug use: Not Currently    Types: Marijuana   Sexual activity: Not Currently    Birth control/protection: Post-menopausal  Other Topics Concern   Not on file  Social History Narrative   No regular exercise.    Social Determinants of Health   Financial Resource Strain: Not on file  Food Insecurity: Not on file  Transportation Needs: Not on file  Physical Activity: Not on file  Stress: Not on file  Social Connections: Not on file  Intimate Partner Violence: Not on file    FAMILY HISTORY: Family History  Problem Relation Age of Onset   Stroke Mother    Depression Father    Prostate cancer Father    Lung cancer Father    Bipolar disorder Sister        ]   Hepatitis Sister    Stroke Maternal Grandmother    Colon cancer Neg Hx     ALLERGIES:  is allergic to hydrocodone-acetaminophen.  MEDICATIONS:  Current Outpatient Medications  Medication Sig Dispense Refill   clonazePAM (KLONOPIN) 1 MG tablet Take 1 mg by mouth at bedtime as needed for anxiety. For anxiety     ibuprofen (ADVIL) 800 MG tablet Take 1 tablet (800 mg total) by mouth every 8 (eight) hours as needed. 30 tablet 0   NURTEC 75 MG TBDP Take 1 tablet by mouth as needed.     omeprazole (PRILOSEC) 40 MG capsule Take 40 mg by mouth daily.     rosuvastatin (CRESTOR) 5 MG tablet Take 5 mg by mouth daily.     venlafaxine XR (EFFEXOR-XR) 75 MG 24 hr capsule Take 75 mg by mouth daily.     No current facility-administered medications for this visit.    REVIEW OF SYSTEMS:   Constitutional: Denies fevers, chills or abnormal night sweats Eyes: Denies blurriness of vision, double vision or watery eyes Ears, nose, mouth, throat, and face: Denies mucositis or sore throat Respiratory: Denies cough, dyspnea or wheezes Cardiovascular: Denies palpitation, chest discomfort or lower extremity  swelling Gastrointestinal:  Denies nausea, heartburn or change in bowel habits Skin: Denies abnormal skin rashes Lymphatics: Denies new lymphadenopathy or easy bruising Neurological:Denies numbness, tingling or new weaknesses Behavioral/Psych: Mood is stable, no new changes  Breast: Denies any palpable lumps or discharge All other systems were reviewed with the patient and are negative.  PHYSICAL EXAMINATION: ECOG PERFORMANCE STATUS: 0 - Asymptomatic  Vitals:   05/18/22 1429  BP: 121/61  Pulse: 85  Resp: 16  Temp: 97.9 F (36.6 C)  SpO2: 98%   Filed Weights   05/18/22 1429  Weight: 146 lb 3.2 oz (66.3 kg)    Physical exam deferred today in lieu of counseling.  Patient appears alert oriented and in no acute distress  LABORATORY DATA:  I have reviewed the data as listed Lab Results  Component Value Date   WBC 6.6 12/13/2011   HGB 14.4 12/13/2011   HCT  43.2 12/13/2011   MCV 89.1 12/13/2011   PLT 243 12/13/2011   Lab Results  Component Value Date   NA 141 12/13/2011   K 3.6 12/13/2011   CL 104 12/13/2011   CO2 26 12/13/2011    RADIOGRAPHIC STUDIES: I have personally reviewed the radiological reports and agreed with the findings in the report.  ASSESSMENT AND PLAN:  Malignant neoplasm of upper-outer quadrant of right breast in female, estrogen receptor positive ([place]) This is a very pleasant 66 year old postmenopausal female patient with no significant past medical history referred to breast oncology given new diagnosis of right breast invasive ductal carcinoma status post right breast lumpectomy with final pathology showing grade 2 invasive moderately differentiated adenocarcinoma with focal DCIS, 3 lymph nodes negative for carcinoma.  Oncotype DX score resulted at 17, distant risk of recurrence at 9 years is about 5% and no benefit of chemo in this age group.  We have hence discussed about considering antiestrogen therapy after she completes adjuvant radiation.  I have  discussed about tamoxifen versus aromatase inhibitors.  Have discussed mechanism of action of both, adverse effects with each class including but not limited to postmenopausal symptoms, vaginal discharge, risk of DVT/PE, endometrial thickening and endometrial carcinoma with tamoxifen.  We have discussed about adverse effects of aromatase inhibitors including but not limited to postmenopausal symptoms, arthralgias, vaginal dryness, bone loss.  I have recommended that we start her on aromatase inhibitors after radiation and follow-up.  She is agreeable to these recommendations.  All her questions were answered to the best my knowledge.  Total time spent: 30 minutes involving history, review of records, physical exam, counseling and coordination of care All questions were answered. The patient knows to call the clinic with any problems, questions or concerns.    Benay Pike, MD 05/18/22

## 2022-05-18 NOTE — Assessment & Plan Note (Addendum)
This is a very pleasant 66 year old postmenopausal female patient with no significant past medical history referred to breast oncology given new diagnosis of right breast invasive ductal carcinoma status post right breast lumpectomy with final pathology showing grade 2 invasive moderately differentiated adenocarcinoma with focal DCIS, 3 lymph nodes negative for carcinoma.  Oncotype DX score resulted at 17, distant risk of recurrence at 9 years is about 5% and no benefit of chemo in this age group.  We have hence discussed about considering antiestrogen therapy after she completes adjuvant radiation.  I have discussed about tamoxifen versus aromatase inhibitors.  Have discussed mechanism of action of both, adverse effects with each class including but not limited to postmenopausal symptoms, vaginal discharge, risk of DVT/PE, endometrial thickening and endometrial carcinoma with tamoxifen.  We have discussed about adverse effects of aromatase inhibitors including but not limited to postmenopausal symptoms, arthralgias, vaginal dryness, bone loss.  I have recommended that we start her on aromatase inhibitors after radiation and follow-up.  She is agreeable to these recommendations.  All her questions were answered to the best my knowledge.

## 2022-05-23 ENCOUNTER — Encounter: Payer: Self-pay | Admitting: *Deleted

## 2022-05-23 NOTE — Progress Notes (Signed)
Radiation Oncology         [phone] ________________________________  Name: Connie May MRN: [id number]  Date: 05/24/2022  DOB: [birth year]  Follow-Up Visit Note  Outpatient  CC: Celene Squibb, MD  Benay Pike, MD  Diagnosis:   No diagnosis found.   Right Breast UOQ, Invasive moderately differentiated ductal adenocarcinoma with intermediate grade DCIS, ER+ / PR- / Her2-, Grade 2: s/p lumpectomy and SLN bx's    Cancer Staging  Malignant neoplasm of upper-outer quadrant of right breast in female, estrogen receptor positive ([facility]) Staging form: Breast, AJCC 8th Edition - Clinical stage from 03/25/2022: Stage Unknown (cTX, cN0, cM0, G1, ER+, PR-, HER2-) - Signed by Benay Pike, MD on 03/25/2022  CHIEF COMPLAINT: Here to discuss management of right breast cancer  Narrative:  The patient returns today for follow-up.     Since consultation date of 03/25/22, the patient opted to proceed with right breast lumpectomy and nodal biopsies on 04/28/22 under the care of Dr. Brantley Stage. Pathology from the procedure revealed: tumor size of 1.6 x 0.7 x 0.5 cm; histology of grade 2 invasive moderately differentiated ductal adenocarcinoma with focal intermediate grade DCIS; all margins negative for both invasive and in-situ disease; margin status to invasive disease of 5 mm from the posterior margin; margin status to in situ disease of 5 mm from the posterior margin; nodal status of 3/3 right axillary SLN excisions negative for carcinoma;  ER status: 100% positive with strong staining intensity; PR status negative; Proliferation marker Ki67 at 10%; Her2 status negative; Grade 2.  Oncotype DX was obtained on the final surgical sample and the recurrence score of 17 predicts a risk of recurrence outside the breast over the next 9 years of 5%, if the patient's only systemic therapy is an antiestrogen for 5 years.  It also predicts no significant benefit from chemotherapy.  Given on Oncotype results,  the patient will not require chemotherapy and has agreed to proceed with AI following completion of XRT per Dr. Chryl Heck.   Symptomatically, the patient reports: ***        ALLERGIES:  is allergic to hydrocodone-acetaminophen.  Meds: Current Outpatient Medications  Medication Sig Dispense Refill   clonazePAM (KLONOPIN) 1 MG tablet Take 1 mg by mouth at bedtime as needed for anxiety. For anxiety     ibuprofen (ADVIL) 800 MG tablet Take 1 tablet (800 mg total) by mouth every 8 (eight) hours as needed. 30 tablet 0   NURTEC 75 MG TBDP Take 1 tablet by mouth as needed.     omeprazole (PRILOSEC) 40 MG capsule Take 40 mg by mouth daily.     rosuvastatin (CRESTOR) 5 MG tablet Take 5 mg by mouth daily.     venlafaxine XR (EFFEXOR-XR) 75 MG 24 hr capsule Take 75 mg by mouth daily.     No current facility-administered medications for this encounter.    Physical Findings:  vitals were not taken for this visit. .     General: Alert and oriented, in no acute distress HEENT: Head is normocephalic. Extraocular movements are intact. Oropharynx is clear. Neck: Neck is supple, no palpable cervical or supraclavicular lymphadenopathy. Heart: Regular in rate and rhythm with no murmurs, rubs, or gallops. Chest: Clear to auscultation bilaterally, with no rhonchi, wheezes, or rales. Abdomen: Soft, nontender, nondistended, with no rigidity or guarding. Extremities: No cyanosis or edema. Lymphatics: see Neck Exam Musculoskeletal: symmetric strength and muscle tone throughout. Neurologic: No obvious focalities. Speech is fluent.  Psychiatric: Judgment and insight  are intact. Affect is appropriate. Breast exam reveals ***  Lab Findings: Lab Results  Component Value Date   WBC 6.6 12/13/2011   HGB 14.4 12/13/2011   HCT 43.2 12/13/2011   MCV 89.1 12/13/2011   PLT 243 12/13/2011    '@LASTCHEMISTRY'$ @  Radiographic Findings: MM Breast Surgical Specimen  Result Date: 04/28/2022 CLINICAL DATA:  Patient is  status post right breast lumpectomy. EXAM: SPECIMEN RADIOGRAPH OF THE RIGHT BREAST COMPARISON:  Previous exam(s). FINDINGS: Status post excision of the right breast. The radioactive seed and biopsy marker clip are present, completely intact, and were marked for pathology. IMPRESSION: Specimen radiograph of the right breast. Electronically Signed   By: Lovey Newcomer M.D.   On: 04/28/2022 08:22  MM RT RADIOACTIVE SEED LOC MAMMO GUIDE  Result Date: 04/27/2022 CLINICAL DATA:  Biopsy proven invasive mammary carcinoma in the right breast. Needle localization prior to surgery. EXAM: MAMMOGRAPHIC GUIDED RADIOACTIVE SEED LOCALIZATION OF THE RIGHT BREAST COMPARISON:  Previous exam(s). FINDINGS: Patient presents for radioactive seed localization prior to surgery. I met with the patient and we discussed the procedure of seed localization including benefits and alternatives. We discussed the high likelihood of a successful procedure. We discussed the risks of the procedure including infection, bleeding, tissue injury and further surgery. We discussed the low dose of radioactivity involved in the procedure. Informed, written consent was given. The usual time-out protocol was performed immediately prior to the procedure. Using mammographic guidance, sterile technique, 1% lidocaine and an I-125 radioactive seed, coil shaped clip was localized using a lateral to medial approach. The follow-up mammogram images confirm the seed in the expected location and were marked for Dr. Brantley Stage. Follow-up survey of the patient confirms presence of the radioactive seed. Order number of I-125 seed:  [id number]. Total activity:  8.264 millicuries reference Date: 6823 The patient tolerated the procedure well and was released from the [facility]. She was given instructions regarding seed removal. IMPRESSION: Radioactive seed localization right breast. No apparent complications. Electronically Signed   By: Lillia Mountain M.D.   On: 04/27/2022 13:37    Impression/Plan: We discussed adjuvant radiotherapy today.  I recommend *** in order to ***.  I reviewed the logistics, benefits, risks, and potential side effects of this treatment in detail. Risks may include but not necessary be limited to acute and late injury tissue in the radiation fields such as skin irritation (change in color/pigmentation, itching, dryness, pain, peeling). She may experience fatigue. We also discussed possible risk of long term cosmetic changes or scar tissue. There is also a smaller risk for lung toxicity, ***cardiac toxicity, ***brachial plexopathy, ***lymphedema, ***musculoskeletal changes, ***rib fragility or ***induction of a second malignancy, ***late chronic non-healing soft tissue wound.    The patient asked good questions which I answered to her satisfaction. She is enthusiastic about proceeding with treatment. A consent form has been *** signed and placed in her chart.  A total of *** medically necessary complex treatment devices will be fabricated and supervised by me: *** fields with MLCs for custom blocks to protect heart, and lungs;  and, a Vac-lok. MORE COMPLEX DEVICES MAY BE MADE IN DOSIMETRY FOR FIELD IN FIELD BEAMS FOR DOSE HOMOGENEITY.  I have requested : 3D Simulation which is medically necessary to give adequate dose to at risk tissues while sparing lungs and heart.  I have requested a DVH of the following structures: lungs, heart, *** lumpectomy cavity.    The patient will receive *** Gy in *** fractions to the ***  with *** fields.  This will be *** followed by a boost.  On date of service, in total, I spent *** minutes on this encounter. Patient was seen in person.  _____________________________________   Eppie Gibson, MD  This document serves as a record of services personally performed by Eppie Gibson, MD. It was created on her behalf by Roney Mans, a trained medical scribe. The creation of this record is based on the scribe's personal  observations and the provider's statements to them. This document has been checked and approved by the attending provider.

## 2022-05-24 ENCOUNTER — Ambulatory Visit
Admission: RE | Admit: 2022-05-24 | Discharge: 2022-05-24 | Disposition: A | Discharge status: Home / Self Care | Payer: Medicare Other | Source: Ambulatory Visit | Attending: Radiation Oncology | Admitting: Radiation Oncology

## 2022-05-24 ENCOUNTER — Encounter: Payer: Self-pay | Admitting: Radiation Oncology

## 2022-05-24 DIAGNOSIS — C50411 Malignant neoplasm of upper-outer quadrant of right female breast: Secondary | ICD-10-CM

## 2022-05-24 DIAGNOSIS — Z170 Estrogen receptor positive status [ER+]: Secondary | ICD-10-CM | POA: Diagnosis not present

## 2022-05-24 NOTE — Progress Notes (Signed)
Location of Breast Cancer:  Malignant neoplasm of upper-outer quadrant of right female breast, estrogen receptor positive  Histology per Pathology Report:  04/28/2022 FINAL MICROSCOPIC DIAGNOSIS:  A. 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 RIGHT AXILLARY SENTINEL LYMPH NODE, EXCISION:  - One benign lymph node, negative for carcinoma (0/1)  C. RIGHT AXILLARY SENTINEL LYMPH NODE, EXCISION:  - One benign lymph node, negative for carcinoma (0/1)  D. RIGHT AXILLARY SENTINEL LYMPH NODE, EXCISION:  - One benign lymph node, negative for carcinoma (0/1)  Receptor Status: ER(100%), PR (Negative), Her2-neu (Negative), Ki-67(10%)  Past/Anticipated interventions by surgeon, if any:  04/28/2022 --Dr. Marcello Moores [place] Right breast seed localized lumpectomy with right axillary sentinel lymph node mapping using mag trace  Past/Anticipated interventions by medical oncology, if any:  Under care of Dr. Arletha Pili Iruku 05/18/2022 We have hence discussed about considering antiestrogen therapy after she completes adjuvant radiation.   I have discussed about tamoxifen versus aromatase inhibitors.  Have discussed mechanism of action of both, adverse effects with each class  I have recommended that we start her on aromatase inhibitors after radiation and follow-up.   She is agreeable to these recommendations.   Lymphedema issues, if any:  A little swelling in breast  from surgery    Pain issues, if any:  No   SAFETY ISSUES: Prior radiation? No Pacemaker/ICD? No Possible current  pregnancy? No--postmenopausal Is the patient on methotrexate? No  Current Complaints / other details:  no other concerns

## 2022-05-25 ENCOUNTER — Ambulatory Visit
Admission: RE | Admit: 2022-05-25 | Discharge: 2022-05-25 | Disposition: A | Discharge status: Home / Self Care | Payer: Medicare Other | Source: Ambulatory Visit | Attending: Radiation Oncology | Admitting: Radiation Oncology

## 2022-05-25 ENCOUNTER — Other Ambulatory Visit: Payer: Self-pay

## 2022-05-25 DIAGNOSIS — Z170 Estrogen receptor positive status [ER+]: Secondary | ICD-10-CM | POA: Diagnosis not present

## 2022-05-25 DIAGNOSIS — C50411 Malignant neoplasm of upper-outer quadrant of right female breast: Secondary | ICD-10-CM | POA: Diagnosis not present

## 2022-05-31 ENCOUNTER — Encounter: Payer: Self-pay | Admitting: *Deleted

## 2022-06-07 DIAGNOSIS — C50411 Malignant neoplasm of upper-outer quadrant of right female breast: Secondary | ICD-10-CM | POA: Diagnosis not present

## 2022-06-07 DIAGNOSIS — Z170 Estrogen receptor positive status [ER+]: Secondary | ICD-10-CM | POA: Diagnosis not present

## 2022-06-08 ENCOUNTER — Other Ambulatory Visit: Payer: Self-pay

## 2022-06-08 ENCOUNTER — Ambulatory Visit
Admission: RE | Admit: 2022-06-08 | Discharge: 2022-06-08 | Disposition: A | Discharge status: Home / Self Care | Payer: Medicare Other | Source: Ambulatory Visit | Attending: Radiation Oncology | Admitting: Radiation Oncology

## 2022-06-08 DIAGNOSIS — C50411 Malignant neoplasm of upper-outer quadrant of right female breast: Secondary | ICD-10-CM | POA: Diagnosis not present

## 2022-06-08 DIAGNOSIS — Z170 Estrogen receptor positive status [ER+]: Secondary | ICD-10-CM | POA: Diagnosis not present

## 2022-06-08 DIAGNOSIS — Z510 Encounter for antineoplastic radiation therapy: Secondary | ICD-10-CM | POA: Diagnosis not present

## 2022-06-08 LAB — RAD ONC ARIA SESSION SUMMARY
Course Elapsed Days: 0
Plan Fractions Treated to Date: 1
Plan Prescribed Dose Per Fraction: 2.67 Gy
Plan Total Fractions Prescribed: 15
Plan Total Prescribed Dose: 40.05 Gy
Reference Point Dosage Given to Date: 2.67 Gy
Reference Point Session Dosage Given: 2.67 Gy
Session Number: 1

## 2022-06-09 ENCOUNTER — Ambulatory Visit
Admission: RE | Admit: 2022-06-09 | Discharge: 2022-06-09 | Disposition: A | Discharge status: Home / Self Care | Payer: Medicare Other | Source: Ambulatory Visit | Attending: Radiation Oncology | Admitting: Radiation Oncology

## 2022-06-09 ENCOUNTER — Other Ambulatory Visit: Payer: Self-pay

## 2022-06-09 DIAGNOSIS — Z170 Estrogen receptor positive status [ER+]: Secondary | ICD-10-CM | POA: Diagnosis not present

## 2022-06-09 DIAGNOSIS — C50411 Malignant neoplasm of upper-outer quadrant of right female breast: Secondary | ICD-10-CM | POA: Diagnosis not present

## 2022-06-09 DIAGNOSIS — Z510 Encounter for antineoplastic radiation therapy: Secondary | ICD-10-CM | POA: Diagnosis not present

## 2022-06-09 LAB — RAD ONC ARIA SESSION SUMMARY
Course Elapsed Days: 1
Plan Fractions Treated to Date: 2
Plan Prescribed Dose Per Fraction: 2.67 Gy
Plan Total Fractions Prescribed: 15
Plan Total Prescribed Dose: 40.05 Gy
Reference Point Dosage Given to Date: 5.34 Gy
Reference Point Session Dosage Given: 2.67 Gy
Session Number: 2

## 2022-06-10 ENCOUNTER — Other Ambulatory Visit: Payer: Self-pay

## 2022-06-10 ENCOUNTER — Ambulatory Visit
Admission: RE | Admit: 2022-06-10 | Discharge: 2022-06-10 | Disposition: A | Discharge status: Home / Self Care | Payer: Medicare Other | Source: Ambulatory Visit | Attending: Radiation Oncology | Admitting: Radiation Oncology

## 2022-06-10 DIAGNOSIS — C50411 Malignant neoplasm of upper-outer quadrant of right female breast: Secondary | ICD-10-CM | POA: Diagnosis not present

## 2022-06-10 DIAGNOSIS — Z170 Estrogen receptor positive status [ER+]: Secondary | ICD-10-CM | POA: Diagnosis not present

## 2022-06-10 DIAGNOSIS — Z510 Encounter for antineoplastic radiation therapy: Secondary | ICD-10-CM | POA: Diagnosis not present

## 2022-06-10 LAB — RAD ONC ARIA SESSION SUMMARY
Course Elapsed Days: 2
Plan Fractions Treated to Date: 3
Plan Prescribed Dose Per Fraction: 2.67 Gy
Plan Total Fractions Prescribed: 15
Plan Total Prescribed Dose: 40.05 Gy
Reference Point Dosage Given to Date: 8.01 Gy
Reference Point Session Dosage Given: 2.67 Gy
Session Number: 3

## 2022-06-13 ENCOUNTER — Ambulatory Visit
Admission: RE | Admit: 2022-06-13 | Discharge: 2022-06-13 | Disposition: A | Discharge status: Home / Self Care | Payer: Medicare Other | Source: Ambulatory Visit | Attending: Radiation Oncology | Admitting: Radiation Oncology

## 2022-06-13 ENCOUNTER — Other Ambulatory Visit: Payer: Self-pay

## 2022-06-13 DIAGNOSIS — Z170 Estrogen receptor positive status [ER+]: Secondary | ICD-10-CM | POA: Diagnosis not present

## 2022-06-13 DIAGNOSIS — C50411 Malignant neoplasm of upper-outer quadrant of right female breast: Secondary | ICD-10-CM | POA: Diagnosis not present

## 2022-06-13 DIAGNOSIS — Z510 Encounter for antineoplastic radiation therapy: Secondary | ICD-10-CM | POA: Diagnosis not present

## 2022-06-13 LAB — RAD ONC ARIA SESSION SUMMARY
Course Elapsed Days: 5
Plan Fractions Treated to Date: 4
Plan Prescribed Dose Per Fraction: 2.67 Gy
Plan Total Fractions Prescribed: 15
Plan Total Prescribed Dose: 40.05 Gy
Reference Point Dosage Given to Date: 10.68 Gy
Reference Point Session Dosage Given: 2.67 Gy
Session Number: 4

## 2022-06-13 MED ORDER — RADIAPLEXRX EX GEL: Freq: Two times a day (BID) | CUTANEOUS | Status: DC

## 2022-06-13 MED ORDER — ALRA NON-METALLIC DEODORANT (RAD-ONC)
1.0000 | Freq: Once | TOPICAL | Status: AC
  Administered 2022-06-13: 1 via TOPICAL

## 2022-06-13 NOTE — Progress Notes (Signed)

## 2022-06-14 ENCOUNTER — Other Ambulatory Visit: Payer: Self-pay

## 2022-06-14 ENCOUNTER — Ambulatory Visit
Admission: RE | Admit: 2022-06-14 | Discharge: 2022-06-14 | Disposition: A | Discharge status: Home / Self Care | Payer: Medicare Other | Source: Ambulatory Visit | Attending: Radiation Oncology | Admitting: Radiation Oncology

## 2022-06-14 DIAGNOSIS — Z510 Encounter for antineoplastic radiation therapy: Secondary | ICD-10-CM | POA: Diagnosis not present

## 2022-06-14 DIAGNOSIS — Z170 Estrogen receptor positive status [ER+]: Secondary | ICD-10-CM | POA: Diagnosis not present

## 2022-06-14 DIAGNOSIS — C50411 Malignant neoplasm of upper-outer quadrant of right female breast: Secondary | ICD-10-CM | POA: Diagnosis not present

## 2022-06-14 LAB — RAD ONC ARIA SESSION SUMMARY
Course Elapsed Days: 6
Plan Fractions Treated to Date: 5
Plan Prescribed Dose Per Fraction: 2.67 Gy
Plan Total Fractions Prescribed: 15
Plan Total Prescribed Dose: 40.05 Gy
Reference Point Dosage Given to Date: 13.35 Gy
Reference Point Session Dosage Given: 2.67 Gy
Session Number: 5

## 2022-06-15 ENCOUNTER — Other Ambulatory Visit: Payer: Self-pay

## 2022-06-15 ENCOUNTER — Ambulatory Visit
Admission: RE | Admit: 2022-06-15 | Discharge: 2022-06-15 | Disposition: A | Discharge status: Home / Self Care | Payer: Medicare Other | Source: Ambulatory Visit | Attending: Radiation Oncology | Admitting: Radiation Oncology

## 2022-06-15 DIAGNOSIS — C50411 Malignant neoplasm of upper-outer quadrant of right female breast: Secondary | ICD-10-CM | POA: Diagnosis not present

## 2022-06-15 DIAGNOSIS — Z510 Encounter for antineoplastic radiation therapy: Secondary | ICD-10-CM | POA: Diagnosis not present

## 2022-06-15 DIAGNOSIS — Z170 Estrogen receptor positive status [ER+]: Secondary | ICD-10-CM | POA: Diagnosis not present

## 2022-06-15 LAB — RAD ONC ARIA SESSION SUMMARY
Course Elapsed Days: 7
Plan Fractions Treated to Date: 6
Plan Prescribed Dose Per Fraction: 2.67 Gy
Plan Total Fractions Prescribed: 15
Plan Total Prescribed Dose: 40.05 Gy
Reference Point Dosage Given to Date: 16.02 Gy
Reference Point Session Dosage Given: 2.67 Gy
Session Number: 6

## 2022-06-16 ENCOUNTER — Ambulatory Visit
Admission: RE | Admit: 2022-06-16 | Discharge: 2022-06-16 | Disposition: A | Discharge status: Home / Self Care | Payer: Medicare Other | Source: Ambulatory Visit | Attending: Radiation Oncology | Admitting: Radiation Oncology

## 2022-06-16 ENCOUNTER — Other Ambulatory Visit: Payer: Self-pay

## 2022-06-16 DIAGNOSIS — C50411 Malignant neoplasm of upper-outer quadrant of right female breast: Secondary | ICD-10-CM | POA: Diagnosis not present

## 2022-06-16 DIAGNOSIS — Z170 Estrogen receptor positive status [ER+]: Secondary | ICD-10-CM | POA: Diagnosis not present

## 2022-06-16 DIAGNOSIS — Z510 Encounter for antineoplastic radiation therapy: Secondary | ICD-10-CM | POA: Diagnosis not present

## 2022-06-16 LAB — RAD ONC ARIA SESSION SUMMARY
Course Elapsed Days: 8
Plan Fractions Treated to Date: 7
Plan Prescribed Dose Per Fraction: 2.67 Gy
Plan Total Fractions Prescribed: 15
Plan Total Prescribed Dose: 40.05 Gy
Reference Point Dosage Given to Date: 18.69 Gy
Reference Point Session Dosage Given: 2.67 Gy
Session Number: 7

## 2022-06-17 ENCOUNTER — Other Ambulatory Visit: Payer: Self-pay

## 2022-06-17 ENCOUNTER — Ambulatory Visit
Admission: RE | Admit: 2022-06-17 | Discharge: 2022-06-17 | Disposition: A | Discharge status: Home / Self Care | Payer: Medicare Other | Source: Ambulatory Visit | Attending: Radiation Oncology | Admitting: Radiation Oncology

## 2022-06-17 DIAGNOSIS — C50411 Malignant neoplasm of upper-outer quadrant of right female breast: Secondary | ICD-10-CM | POA: Diagnosis not present

## 2022-06-17 DIAGNOSIS — Z170 Estrogen receptor positive status [ER+]: Secondary | ICD-10-CM | POA: Diagnosis not present

## 2022-06-17 DIAGNOSIS — Z510 Encounter for antineoplastic radiation therapy: Secondary | ICD-10-CM | POA: Diagnosis not present

## 2022-06-17 LAB — RAD ONC ARIA SESSION SUMMARY
Course Elapsed Days: 9
Plan Fractions Treated to Date: 8
Plan Prescribed Dose Per Fraction: 2.67 Gy
Plan Total Fractions Prescribed: 15
Plan Total Prescribed Dose: 40.05 Gy
Reference Point Dosage Given to Date: 21.36 Gy
Reference Point Session Dosage Given: 2.67 Gy
Session Number: 8

## 2022-06-20 ENCOUNTER — Other Ambulatory Visit: Payer: Self-pay

## 2022-06-20 ENCOUNTER — Ambulatory Visit
Admission: RE | Admit: 2022-06-20 | Discharge: 2022-06-20 | Disposition: A | Discharge status: Home / Self Care | Payer: Medicare Other | Source: Ambulatory Visit | Attending: Radiation Oncology | Admitting: Radiation Oncology

## 2022-06-20 ENCOUNTER — Ambulatory Visit: Payer: Medicare Other

## 2022-06-20 DIAGNOSIS — Z170 Estrogen receptor positive status [ER+]: Secondary | ICD-10-CM | POA: Diagnosis not present

## 2022-06-20 DIAGNOSIS — Z510 Encounter for antineoplastic radiation therapy: Secondary | ICD-10-CM | POA: Diagnosis not present

## 2022-06-20 DIAGNOSIS — C50411 Malignant neoplasm of upper-outer quadrant of right female breast: Secondary | ICD-10-CM | POA: Diagnosis not present

## 2022-06-20 LAB — RAD ONC ARIA SESSION SUMMARY
Course Elapsed Days: 12
Plan Fractions Treated to Date: 9
Plan Prescribed Dose Per Fraction: 2.67 Gy
Plan Total Fractions Prescribed: 15
Plan Total Prescribed Dose: 40.05 Gy
Reference Point Dosage Given to Date: 24.03 Gy
Reference Point Session Dosage Given: 2.67 Gy
Session Number: 9

## 2022-06-21 ENCOUNTER — Other Ambulatory Visit: Payer: Self-pay

## 2022-06-21 ENCOUNTER — Ambulatory Visit
Admission: RE | Admit: 2022-06-21 | Discharge: 2022-06-21 | Disposition: A | Discharge status: Home / Self Care | Payer: Medicare Other | Source: Ambulatory Visit | Attending: Radiation Oncology | Admitting: Radiation Oncology

## 2022-06-21 DIAGNOSIS — C50411 Malignant neoplasm of upper-outer quadrant of right female breast: Secondary | ICD-10-CM | POA: Diagnosis not present

## 2022-06-21 DIAGNOSIS — Z170 Estrogen receptor positive status [ER+]: Secondary | ICD-10-CM | POA: Diagnosis not present

## 2022-06-21 DIAGNOSIS — Z510 Encounter for antineoplastic radiation therapy: Secondary | ICD-10-CM | POA: Diagnosis not present

## 2022-06-21 LAB — RAD ONC ARIA SESSION SUMMARY
Course Elapsed Days: 13
Plan Fractions Treated to Date: 10
Plan Prescribed Dose Per Fraction: 2.67 Gy
Plan Total Fractions Prescribed: 15
Plan Total Prescribed Dose: 40.05 Gy
Reference Point Dosage Given to Date: 26.7 Gy
Reference Point Session Dosage Given: 2.67 Gy
Session Number: 10

## 2022-06-22 ENCOUNTER — Ambulatory Visit: Payer: Medicare Other

## 2022-06-23 ENCOUNTER — Other Ambulatory Visit: Payer: Self-pay

## 2022-06-23 ENCOUNTER — Ambulatory Visit
Admission: RE | Admit: 2022-06-23 | Discharge: 2022-06-23 | Disposition: A | Discharge status: Home / Self Care | Payer: Medicare Other | Source: Ambulatory Visit | Attending: Radiation Oncology | Admitting: Radiation Oncology

## 2022-06-23 DIAGNOSIS — Z510 Encounter for antineoplastic radiation therapy: Secondary | ICD-10-CM | POA: Diagnosis not present

## 2022-06-23 DIAGNOSIS — Z170 Estrogen receptor positive status [ER+]: Secondary | ICD-10-CM | POA: Diagnosis not present

## 2022-06-23 DIAGNOSIS — C50411 Malignant neoplasm of upper-outer quadrant of right female breast: Secondary | ICD-10-CM | POA: Diagnosis not present

## 2022-06-23 LAB — RAD ONC ARIA SESSION SUMMARY
Course Elapsed Days: 15
Plan Fractions Treated to Date: 11
Plan Prescribed Dose Per Fraction: 2.67 Gy
Plan Total Fractions Prescribed: 15
Plan Total Prescribed Dose: 40.05 Gy
Reference Point Dosage Given to Date: 29.37 Gy
Reference Point Session Dosage Given: 2.67 Gy
Session Number: 11

## 2022-06-24 ENCOUNTER — Other Ambulatory Visit: Payer: Self-pay

## 2022-06-24 ENCOUNTER — Ambulatory Visit
Admission: RE | Admit: 2022-06-24 | Discharge: 2022-06-24 | Disposition: A | Discharge status: Home / Self Care | Payer: Medicare Other | Source: Ambulatory Visit | Attending: Radiation Oncology | Admitting: Radiation Oncology

## 2022-06-24 DIAGNOSIS — C50411 Malignant neoplasm of upper-outer quadrant of right female breast: Secondary | ICD-10-CM | POA: Diagnosis not present

## 2022-06-24 DIAGNOSIS — Z170 Estrogen receptor positive status [ER+]: Secondary | ICD-10-CM | POA: Diagnosis not present

## 2022-06-24 DIAGNOSIS — Z510 Encounter for antineoplastic radiation therapy: Secondary | ICD-10-CM | POA: Diagnosis not present

## 2022-06-24 LAB — RAD ONC ARIA SESSION SUMMARY
Course Elapsed Days: 16
Plan Fractions Treated to Date: 12
Plan Prescribed Dose Per Fraction: 2.67 Gy
Plan Total Fractions Prescribed: 15
Plan Total Prescribed Dose: 40.05 Gy
Reference Point Dosage Given to Date: 32.04 Gy
Reference Point Session Dosage Given: 2.67 Gy
Session Number: 12

## 2022-06-27 ENCOUNTER — Other Ambulatory Visit: Payer: Self-pay

## 2022-06-27 ENCOUNTER — Ambulatory Visit: Payer: Medicare Other | Admitting: Radiation Oncology

## 2022-06-27 ENCOUNTER — Ambulatory Visit
Admission: RE | Admit: 2022-06-27 | Discharge: 2022-06-27 | Disposition: A | Discharge status: Home / Self Care | Payer: Medicare Other | Source: Ambulatory Visit | Attending: Radiation Oncology | Admitting: Radiation Oncology

## 2022-06-27 ENCOUNTER — Ambulatory Visit: Payer: Medicare Other

## 2022-06-27 DIAGNOSIS — Z510 Encounter for antineoplastic radiation therapy: Secondary | ICD-10-CM | POA: Diagnosis not present

## 2022-06-27 DIAGNOSIS — Z170 Estrogen receptor positive status [ER+]: Secondary | ICD-10-CM | POA: Diagnosis not present

## 2022-06-27 DIAGNOSIS — C50411 Malignant neoplasm of upper-outer quadrant of right female breast: Secondary | ICD-10-CM | POA: Diagnosis not present

## 2022-06-27 LAB — RAD ONC ARIA SESSION SUMMARY
Course Elapsed Days: 19
Plan Fractions Treated to Date: 13
Plan Prescribed Dose Per Fraction: 2.67 Gy
Plan Total Fractions Prescribed: 15
Plan Total Prescribed Dose: 40.05 Gy
Reference Point Dosage Given to Date: 34.71 Gy
Reference Point Session Dosage Given: 2.67 Gy
Session Number: 13

## 2022-06-28 ENCOUNTER — Ambulatory Visit
Admission: RE | Admit: 2022-06-28 | Discharge: 2022-06-28 | Disposition: A | Discharge status: Home / Self Care | Payer: Medicare Other | Source: Ambulatory Visit | Attending: Radiation Oncology | Admitting: Radiation Oncology

## 2022-06-28 ENCOUNTER — Other Ambulatory Visit: Payer: Self-pay

## 2022-06-28 DIAGNOSIS — Z510 Encounter for antineoplastic radiation therapy: Secondary | ICD-10-CM | POA: Diagnosis not present

## 2022-06-28 DIAGNOSIS — C50411 Malignant neoplasm of upper-outer quadrant of right female breast: Secondary | ICD-10-CM | POA: Diagnosis not present

## 2022-06-28 DIAGNOSIS — Z170 Estrogen receptor positive status [ER+]: Secondary | ICD-10-CM | POA: Diagnosis not present

## 2022-06-28 LAB — RAD ONC ARIA SESSION SUMMARY
Course Elapsed Days: 20
Plan Fractions Treated to Date: 14
Plan Prescribed Dose Per Fraction: 2.67 Gy
Plan Total Fractions Prescribed: 15
Plan Total Prescribed Dose: 40.05 Gy
Reference Point Dosage Given to Date: 37.38 Gy
Reference Point Session Dosage Given: 2.67 Gy
Session Number: 14

## 2022-06-29 ENCOUNTER — Ambulatory Visit: Payer: Medicare Other

## 2022-06-29 ENCOUNTER — Other Ambulatory Visit: Payer: Self-pay

## 2022-06-29 ENCOUNTER — Ambulatory Visit
Admission: RE | Admit: 2022-06-29 | Discharge: 2022-06-29 | Disposition: A | Discharge status: Home / Self Care | Payer: Medicare Other | Source: Ambulatory Visit | Attending: Radiation Oncology | Admitting: Radiation Oncology

## 2022-06-29 DIAGNOSIS — Z510 Encounter for antineoplastic radiation therapy: Secondary | ICD-10-CM | POA: Diagnosis not present

## 2022-06-29 DIAGNOSIS — C50411 Malignant neoplasm of upper-outer quadrant of right female breast: Secondary | ICD-10-CM | POA: Diagnosis not present

## 2022-06-29 DIAGNOSIS — Z170 Estrogen receptor positive status [ER+]: Secondary | ICD-10-CM | POA: Diagnosis not present

## 2022-06-29 LAB — RAD ONC ARIA SESSION SUMMARY
Course Elapsed Days: 21
Plan Fractions Treated to Date: 15
Plan Prescribed Dose Per Fraction: 2.67 Gy
Plan Total Fractions Prescribed: 15
Plan Total Prescribed Dose: 40.05 Gy
Reference Point Dosage Given to Date: 40.05 Gy
Reference Point Session Dosage Given: 2.67 Gy
Session Number: 15

## 2022-06-30 ENCOUNTER — Ambulatory Visit
Admission: RE | Admit: 2022-06-30 | Discharge: 2022-06-30 | Disposition: A | Discharge status: Home / Self Care | Payer: Medicare Other | Source: Ambulatory Visit | Attending: Radiation Oncology | Admitting: Radiation Oncology

## 2022-06-30 ENCOUNTER — Ambulatory Visit: Payer: Medicare Other

## 2022-06-30 ENCOUNTER — Other Ambulatory Visit: Payer: Self-pay

## 2022-06-30 DIAGNOSIS — C50411 Malignant neoplasm of upper-outer quadrant of right female breast: Secondary | ICD-10-CM | POA: Diagnosis not present

## 2022-06-30 DIAGNOSIS — Z510 Encounter for antineoplastic radiation therapy: Secondary | ICD-10-CM | POA: Diagnosis not present

## 2022-06-30 DIAGNOSIS — Z170 Estrogen receptor positive status [ER+]: Secondary | ICD-10-CM | POA: Diagnosis not present

## 2022-06-30 LAB — RAD ONC ARIA SESSION SUMMARY
Course Elapsed Days: 22
Plan Fractions Treated to Date: 1
Plan Prescribed Dose Per Fraction: 2 Gy
Plan Total Fractions Prescribed: 5
Plan Total Prescribed Dose: 10 Gy
Reference Point Dosage Given to Date: 2 Gy
Reference Point Session Dosage Given: 2 Gy
Session Number: 16

## 2022-07-01 ENCOUNTER — Other Ambulatory Visit: Payer: Self-pay

## 2022-07-01 ENCOUNTER — Ambulatory Visit
Admission: RE | Admit: 2022-07-01 | Discharge: 2022-07-01 | Disposition: A | Discharge status: Home / Self Care | Payer: Medicare Other | Source: Ambulatory Visit | Attending: Radiation Oncology | Admitting: Radiation Oncology

## 2022-07-01 DIAGNOSIS — C50411 Malignant neoplasm of upper-outer quadrant of right female breast: Secondary | ICD-10-CM | POA: Diagnosis not present

## 2022-07-01 LAB — RAD ONC ARIA SESSION SUMMARY
Course Elapsed Days: 23
Plan Fractions Treated to Date: 2
Plan Prescribed Dose Per Fraction: 2 Gy
Plan Total Fractions Prescribed: 5
Plan Total Prescribed Dose: 10 Gy
Reference Point Dosage Given to Date: 4 Gy
Reference Point Session Dosage Given: 2 Gy
Session Number: 17

## 2022-07-04 ENCOUNTER — Ambulatory Visit
Admission: RE | Admit: 2022-07-04 | Discharge: 2022-07-04 | Disposition: A | Discharge status: Home / Self Care | Payer: Medicare Other | Source: Ambulatory Visit | Attending: Radiation Oncology | Admitting: Radiation Oncology

## 2022-07-04 ENCOUNTER — Other Ambulatory Visit: Payer: Self-pay

## 2022-07-04 ENCOUNTER — Ambulatory Visit: Payer: Medicare Other

## 2022-07-04 DIAGNOSIS — C50411 Malignant neoplasm of upper-outer quadrant of right female breast: Secondary | ICD-10-CM | POA: Insufficient documentation

## 2022-07-04 LAB — RAD ONC ARIA SESSION SUMMARY
Course Elapsed Days: 26
Plan Fractions Treated to Date: 3
Plan Prescribed Dose Per Fraction: 2 Gy
Plan Total Fractions Prescribed: 5
Plan Total Prescribed Dose: 10 Gy
Reference Point Dosage Given to Date: 6 Gy
Reference Point Session Dosage Given: 2 Gy
Session Number: 18

## 2022-07-05 ENCOUNTER — Ambulatory Visit: Payer: Medicare Other

## 2022-07-05 ENCOUNTER — Other Ambulatory Visit: Payer: Self-pay

## 2022-07-05 ENCOUNTER — Ambulatory Visit
Admission: RE | Admit: 2022-07-05 | Discharge: 2022-07-05 | Disposition: A | Discharge status: Home / Self Care | Payer: Medicare Other | Source: Ambulatory Visit | Attending: Radiation Oncology | Admitting: Radiation Oncology

## 2022-07-05 DIAGNOSIS — C50411 Malignant neoplasm of upper-outer quadrant of right female breast: Secondary | ICD-10-CM | POA: Diagnosis not present

## 2022-07-05 LAB — RAD ONC ARIA SESSION SUMMARY
Course Elapsed Days: 27
Plan Fractions Treated to Date: 4
Plan Prescribed Dose Per Fraction: 2 Gy
Plan Total Fractions Prescribed: 5
Plan Total Prescribed Dose: 10 Gy
Reference Point Dosage Given to Date: 8 Gy
Reference Point Session Dosage Given: 2 Gy
Session Number: 19

## 2022-07-06 ENCOUNTER — Encounter: Payer: Self-pay | Admitting: Radiation Oncology

## 2022-07-06 ENCOUNTER — Ambulatory Visit
Admission: RE | Admit: 2022-07-06 | Discharge: 2022-07-06 | Disposition: A | Discharge status: Home / Self Care | Payer: Medicare Other | Source: Ambulatory Visit | Attending: Radiation Oncology | Admitting: Radiation Oncology

## 2022-07-06 ENCOUNTER — Other Ambulatory Visit: Payer: Self-pay

## 2022-07-06 DIAGNOSIS — C50411 Malignant neoplasm of upper-outer quadrant of right female breast: Secondary | ICD-10-CM | POA: Diagnosis not present

## 2022-07-06 DIAGNOSIS — Z170 Estrogen receptor positive status [ER+]: Secondary | ICD-10-CM | POA: Diagnosis not present

## 2022-07-06 DIAGNOSIS — Z510 Encounter for antineoplastic radiation therapy: Secondary | ICD-10-CM | POA: Diagnosis not present

## 2022-07-06 LAB — RAD ONC ARIA SESSION SUMMARY
Course Elapsed Days: 28
Plan Fractions Treated to Date: 5
Plan Prescribed Dose Per Fraction: 2 Gy
Plan Total Fractions Prescribed: 5
Plan Total Prescribed Dose: 10 Gy
Reference Point Dosage Given to Date: 10 Gy
Reference Point Session Dosage Given: 2 Gy
Session Number: 20

## 2022-07-11 NOTE — Progress Notes (Addendum)
                                                                                                                                                             Patient Name: Connie May MRN: [id number] DOB: Sep 24, 1956 Referring Physician: Wende Neighbors (Profile Not Attached) Date of Service: 07/06/2022 [facility], [place]                                                        End Of Treatment Note  Diagnoses: C50.411-Malignant neoplasm of upper-outer quadrant of right female breast  Cancer Staging: pT1c, pN0   Intent: Curative  Radiation Treatment Dates: 06/08/2022 through 07/06/2022 Site Technique Total Dose (Gy) Dose per Fx (Gy) Completed Fx Beam Energies  Breast, Right: Breast_R 3D 40.05/40.05 2.67 15/15 6X  Breast, Right: Breast_R_Bst 3D 10/10 2 5/5 6X, 10X   Narrative: The patient tolerated radiation therapy relatively well.   Plan: The patient will follow-up with radiation oncology in 76mo.  -----------------------------------  SEppie Gibson MD

## 2022-07-12 ENCOUNTER — Encounter: Payer: Self-pay | Admitting: *Deleted

## 2022-07-12 DIAGNOSIS — C50411 Malignant neoplasm of upper-outer quadrant of right female breast: Secondary | ICD-10-CM

## 2022-07-12 DIAGNOSIS — Z170 Estrogen receptor positive status [ER+]: Secondary | ICD-10-CM

## 2022-08-04 ENCOUNTER — Ambulatory Visit: Payer: Medicare Other | Admitting: Hematology and Oncology

## 2022-08-04 ENCOUNTER — Telehealth: Payer: Self-pay | Admitting: *Deleted

## 2022-08-04 NOTE — Telephone Encounter (Signed)
RETURNED PATIENT'S PHONE CALL, SPOKE WITH PATIENT. ?

## 2022-08-15 DIAGNOSIS — E785 Hyperlipidemia, unspecified: Secondary | ICD-10-CM | POA: Diagnosis not present

## 2022-08-17 ENCOUNTER — Ambulatory Visit: Payer: Self-pay | Admitting: Radiation Oncology

## 2022-08-17 DIAGNOSIS — E870 Hyperosmolality and hypernatremia: Secondary | ICD-10-CM | POA: Insufficient documentation

## 2022-08-17 DIAGNOSIS — R7301 Impaired fasting glucose: Secondary | ICD-10-CM | POA: Insufficient documentation

## 2022-08-18 DIAGNOSIS — G43909 Migraine, unspecified, not intractable, without status migrainosus: Secondary | ICD-10-CM | POA: Diagnosis not present

## 2022-08-18 DIAGNOSIS — M5030 Other cervical disc degeneration, unspecified cervical region: Secondary | ICD-10-CM | POA: Insufficient documentation

## 2022-08-18 DIAGNOSIS — K219 Gastro-esophageal reflux disease without esophagitis: Secondary | ICD-10-CM | POA: Insufficient documentation

## 2022-08-18 DIAGNOSIS — K449 Diaphragmatic hernia without obstruction or gangrene: Secondary | ICD-10-CM | POA: Diagnosis not present

## 2022-08-18 DIAGNOSIS — R7301 Impaired fasting glucose: Secondary | ICD-10-CM | POA: Diagnosis not present

## 2022-08-18 DIAGNOSIS — C50411 Malignant neoplasm of upper-outer quadrant of right female breast: Secondary | ICD-10-CM | POA: Diagnosis not present

## 2022-08-18 DIAGNOSIS — Z23 Encounter for immunization: Secondary | ICD-10-CM | POA: Diagnosis not present

## 2022-08-18 DIAGNOSIS — E785 Hyperlipidemia, unspecified: Secondary | ICD-10-CM | POA: Diagnosis not present

## 2022-08-18 DIAGNOSIS — Z0000 Encounter for general adult medical examination without abnormal findings: Secondary | ICD-10-CM | POA: Diagnosis not present

## 2022-08-18 DIAGNOSIS — E870 Hyperosmolality and hypernatremia: Secondary | ICD-10-CM | POA: Diagnosis not present

## 2022-08-30 ENCOUNTER — Encounter: Payer: Self-pay | Admitting: *Deleted

## 2022-09-01 ENCOUNTER — Telehealth: Payer: Self-pay | Admitting: Hematology and Oncology

## 2022-09-01 NOTE — Telephone Encounter (Signed)
Called patient per 11/28 in basket message. Patient scheduled for scp and notified.

## 2022-09-02 ENCOUNTER — Inpatient Hospital Stay: Payer: Medicare Other | Attending: Hematology and Oncology | Admitting: Hematology and Oncology

## 2022-09-02 VITALS — BP 124/58 | HR 84 | Temp 97.8°F | Resp 16 | Ht 61.0 in | Wt 147.7 lb

## 2022-09-02 DIAGNOSIS — Z79811 Long term (current) use of aromatase inhibitors: Secondary | ICD-10-CM | POA: Diagnosis not present

## 2022-09-02 DIAGNOSIS — Z170 Estrogen receptor positive status [ER+]: Secondary | ICD-10-CM | POA: Diagnosis not present

## 2022-09-02 DIAGNOSIS — M8580 Other specified disorders of bone density and structure, unspecified site: Secondary | ICD-10-CM | POA: Diagnosis not present

## 2022-09-02 DIAGNOSIS — Z780 Asymptomatic menopausal state: Secondary | ICD-10-CM

## 2022-09-02 DIAGNOSIS — C50411 Malignant neoplasm of upper-outer quadrant of right female breast: Secondary | ICD-10-CM | POA: Insufficient documentation

## 2022-09-02 MED ORDER — ANASTROZOLE 1 MG PO TABS: 1.0000 mg | ORAL_TABLET | Freq: Every day | ORAL | 3 refills | Status: DC

## 2022-09-02 NOTE — Progress Notes (Unsigned)
[facility] CONSULT NOTE  Patient Care Team: Celene Squibb, MD as PCP - General (Internal Medicine) Gala Romney, Cristopher Estimable, MD as Consulting Physician (Gastroenterology) Rockwell Germany, RN as Oncology Nurse Navigator Mauro Kaufmann, RN as Oncology Nurse Navigator  CHIEF COMPLAINTS/PURPOSE OF CONSULTATION:  Newly diagnosed breast cancer  HISTORY OF PRESENTING ILLNESS:  Connie May 66 y.o. female is here because of recent diagnosis of right breast cancer  I reviewed her records extensively and collaborated the history with the patient.  SUMMARY OF ONCOLOGIC HISTORY: Oncology History  Malignant neoplasm of upper-outer quadrant of right breast in female, estrogen receptor positive ([place])  01/20/2022 Mammogram   Mammogram done on April 20 showed right breast focal asymmetry requiring further evaluation of a left breast asymmetry requiring further evaluation.  Diagnostic mammogram done showed persistent distortion in the upper outer right breast without definite sonographic correlate.  No abnormal appearing right axillary lymph nodes.  No persistent mammographic abnormality in the area of the left breast screening study finding.   02/24/2022 Pathology Results   Stereotactic biopsy from the right breast at 9 o'clock position showed invasive well-differentiated ductal adenocarcinoma grade 1 measuring 4 mm in greatest extent, focal intermediate grade DCIS.  Prognostic showed ER 100% positive strong staining PR 0% negative, Ki-67 of 10%, HER2 negative   03/25/2022 Initial Diagnosis   Malignant neoplasm of upper-outer quadrant of right breast in female, estrogen receptor positive ([place])   03/25/2022 Cancer Staging   Staging form: Breast, AJCC 8th Edition - Clinical stage from 03/25/2022: Stage Unknown (cTX, cN0, cM0, G1, ER+, PR-, HER2-) - Signed by Benay Pike, MD on 03/25/2022 Nuclear grade: G1 Histologic grading system: 3 grade system   04/28/2022 Surgery   RIGHT BREAST,  LUMPECTOMY: pT1c, pN0, Oncotype DX Breast Recurrence Score is 17  Final pathology showed grade 2 invasive moderately differentiated adenocarcinoma with focal DCIS.  Tumor measured 1.6 x 0.7 x 0.5 cm, negative margins.  3 lymph nodes negative for carcinoma    Connie May is here after completing adjuvant radiation and has done really well.  She is so far very pleased with the care she received.  She is ready to proceed with antiestrogen therapy.  MEDICAL HISTORY:  Past Medical History:  Diagnosis Date   Acid reflux    Cancer ([place]) 04/2022   right breast IDC   Depression    Headache    Hyperlipidemia    Nephrolithiasis    history of   Panic attacks     SURGICAL HISTORY: Past Surgical History:  Procedure Laterality Date   APPENDECTOMY     BIOPSY  12/17/2018   Procedure: BIOPSY;  Surgeon: Rogene Houston, MD;  [facility];  Service: Endoscopy;;   BREAST LUMPECTOMY WITH RADIOACTIVE SEED AND SENTINEL LYMPH NODE BIOPSY Right 04/28/2022   Procedure: RIGHT BREAST LUMPECTOMY WITH RADIOACTIVE SEED AND SENTINEL LYMPH NODE BIOPSY;  Surgeon: Erroll Luna, MD;  Location: [facility];  Service: General;  Laterality: Right;  GEN & PEC BLOCK   calculus retrieval     x 2    COLONOSCOPY N/A 12/17/2018   Procedure: COLONOSCOPY;  Surgeon: Rogene Houston, MD;  [facility];  Service: Endoscopy;  Laterality: N/A;   open RT knee surgery     POLYPECTOMY  12/17/2018   Procedure: POLYPECTOMY;  Surgeon: Rogene Houston, MD;  [facility];  Service: Endoscopy;;    SOCIAL HISTORY: Social History   Socioeconomic History  Marital status: Married    Spouse name: Not on file   Number of children: 2    Years of education: Not on file   Highest education level: Not on file  Occupational History    Employer: [employer]  Tobacco Use   Smoking status: Never   Smokeless tobacco: Never  Vaping Use   Vaping Use: Never used  Substance and Sexual Activity    Alcohol use: Not Currently   Drug use: Not Currently    Types: Marijuana   Sexual activity: Not Currently    Birth control/protection: Post-menopausal  Other Topics Concern   Not on file  Social History Narrative   No regular exercise.    Social Determinants of Health   Financial Resource Strain: Not on file  Food Insecurity: Not on file  Transportation Needs: Not on file  Physical Activity: Not on file  Stress: Not on file  Social Connections: Not on file  Intimate Partner Violence: Not on file    FAMILY HISTORY: Family History  Problem Relation Age of Onset   Stroke Mother    Depression Father    Prostate cancer Father    Lung cancer Father    Bipolar disorder Sister        ]   Hepatitis Sister    Stroke Maternal Grandmother    Colon cancer Neg Hx     ALLERGIES:  is allergic to hydrocodone-acetaminophen.  MEDICATIONS:  Current Outpatient Medications  Medication Sig Dispense Refill   anastrozole (ARIMIDEX) 1 MG tablet Take 1 tablet (1 mg total) by mouth daily. 90 tablet 3   clonazePAM (KLONOPIN) 1 MG tablet Take 1 mg by mouth at bedtime as needed for anxiety. For anxiety     ibuprofen (ADVIL) 800 MG tablet Take 1 tablet (800 mg total) by mouth every 8 (eight) hours as needed. 30 tablet 0   NURTEC 75 MG TBDP Take 1 tablet by mouth as needed.     omeprazole (PRILOSEC) 40 MG capsule Take 40 mg by mouth daily.     rosuvastatin (CRESTOR) 5 MG tablet Take 5 mg by mouth daily.     venlafaxine XR (EFFEXOR-XR) 75 MG 24 hr capsule Take 75 mg by mouth daily.     No current facility-administered medications for this visit.    REVIEW OF SYSTEMS:   Constitutional: Denies fevers, chills or abnormal night sweats Eyes: Denies blurriness of vision, double vision or watery eyes Ears, nose, mouth, throat, and face: Denies mucositis or sore throat Respiratory: Denies cough, dyspnea or wheezes Cardiovascular: Denies palpitation, chest discomfort or lower extremity  swelling Gastrointestinal:  Denies nausea, heartburn or change in bowel habits Skin: Denies abnormal skin rashes Lymphatics: Denies new lymphadenopathy or easy bruising Neurological:Denies numbness, tingling or new weaknesses Behavioral/Psych: Mood is stable, no new changes  Breast: Denies any palpable lumps or discharge All other systems were reviewed with the patient and are negative.  PHYSICAL EXAMINATION: ECOG PERFORMANCE STATUS: 0 - Asymptomatic  Vitals:   09/02/22 1131  BP: (!) 124/58  Pulse: 84  Resp: 16  Temp: 97.8 F (36.6 C)  SpO2: 100%   Filed Weights   09/02/22 1131  Weight: 147 lb 11.2 oz (67 kg)    Physical exam, postradiation changes in the right breast.  LABORATORY DATA:  I have reviewed the data as listed Lab Results  Component Value Date   WBC 6.6 12/13/2011   HGB 14.4 12/13/2011   HCT 43.2 12/13/2011   MCV 89.1 12/13/2011  PLT 243 12/13/2011   Lab Results  Component Value Date   NA 141 12/13/2011   K 3.6 12/13/2011   CL 104 12/13/2011   CO2 26 12/13/2011    RADIOGRAPHIC STUDIES: I have personally reviewed the radiological reports and agreed with the findings in the report.  ASSESSMENT AND PLAN:  Malignant neoplasm of upper-outer quadrant of right breast in female, estrogen receptor positive ([place]) This is a very pleasant 66 year old postmenopausal female patient with no significant past medical history referred to breast oncology given new diagnosis of right breast invasive ductal carcinoma status post right breast lumpectomy with final pathology showing grade 2 invasive moderately differentiated adenocarcinoma with focal DCIS, 3 lymph nodes negative for carcinoma.  Oncotype DX score resulted at 17, distant risk of recurrence at 9 years is about 5% and no benefit of chemo in this age group.  She is here after completing adjuvant radiation.  We have once again discussed options for antiestrogen therapy today including tamoxifen versus aromatase  inhibitors.  She is leaning towards aromatase inhibitors.  Anastrozole prescribed to the pharmacy of her choice.  Baseline bone density ordered.  She will return to clinic in about 3 to 4 months for survivorship visit as well as toxicity check.  She was however encouraged to call us with any new questions or concerns.   Total time spent: 30 minutes involving history, review of records, physical exam, counseling and coordination of care All questions were answered. The patient knows to call the clinic with any problems, questions or concerns.    Benay Pike, MD 09/06/22

## 2022-09-06 ENCOUNTER — Encounter: Payer: Self-pay | Admitting: Hematology and Oncology

## 2022-09-06 NOTE — Assessment & Plan Note (Signed)
This is a very pleasant 66 year old postmenopausal female patient with no significant past medical history referred to breast oncology given new diagnosis of right breast invasive ductal carcinoma status post right breast lumpectomy with final pathology showing grade 2 invasive moderately differentiated adenocarcinoma with focal DCIS, 3 lymph nodes negative for carcinoma.  Oncotype DX score resulted at 17, distant risk of recurrence at 9 years is about 5% and no benefit of chemo in this age group.  She is here after completing adjuvant radiation.  We have once again discussed options for antiestrogen therapy today including tamoxifen versus aromatase inhibitors.  She is leaning towards aromatase inhibitors.  Anastrozole prescribed to the pharmacy of her choice.  Baseline bone density ordered.  She will return to clinic in about 3 to 4 months for survivorship visit as well as toxicity check.  She was however encouraged to call us with any new questions or concerns.

## 2022-10-10 ENCOUNTER — Ambulatory Visit (HOSPITAL_COMMUNITY)
Admission: RE | Admit: 2022-10-10 | Discharge: 2022-10-10 | Disposition: A | Discharge status: Home / Self Care | Payer: Medicare Other | Source: Ambulatory Visit | Attending: Hematology and Oncology | Admitting: Hematology and Oncology

## 2022-10-10 DIAGNOSIS — M8580 Other specified disorders of bone density and structure, unspecified site: Secondary | ICD-10-CM | POA: Insufficient documentation

## 2022-10-10 DIAGNOSIS — Z780 Asymptomatic menopausal state: Secondary | ICD-10-CM | POA: Insufficient documentation

## 2022-10-10 DIAGNOSIS — M810 Age-related osteoporosis without current pathological fracture: Secondary | ICD-10-CM | POA: Diagnosis not present

## 2022-11-28 ENCOUNTER — Inpatient Hospital Stay: Payer: Medicare Other | Admitting: Adult Health

## 2022-11-28 ENCOUNTER — Encounter: Payer: Medicare Other | Admitting: Adult Health

## 2022-11-28 ENCOUNTER — Encounter: Payer: Self-pay | Admitting: Adult Health

## 2022-11-28 ENCOUNTER — Inpatient Hospital Stay: Payer: Medicare Other | Attending: Hematology and Oncology | Admitting: Adult Health

## 2022-11-28 DIAGNOSIS — Z170 Estrogen receptor positive status [ER+]: Secondary | ICD-10-CM | POA: Diagnosis not present

## 2022-11-28 DIAGNOSIS — C50411 Malignant neoplasm of upper-outer quadrant of right female breast: Secondary | ICD-10-CM | POA: Diagnosis not present

## 2022-11-28 NOTE — Progress Notes (Signed)
SURVIVORSHIP VIRTUAL VISIT:  I connected with Connie May on 11/28/22 at 11:15 AM EST by telephone and verified that I am speaking with the correct person using two identifiers.  I discussed the limitations, risks, security and privacy concerns of performing an evaluation and management service by telephone and the availability of in person appointments. I also discussed with the patient that there May be a patient responsible charge related to this service. The patient expressed understanding and agreed to proceed.   Patient location: home Provider location: [facility] office  BRIEF ONCOLOGIC HISTORY:  Oncology History  Malignant neoplasm of upper-outer quadrant of right breast in female, estrogen receptor positive ([place])  01/20/2022 Mammogram   Mammogram done on April 20 showed right breast focal asymmetry requiring further evaluation of a left breast asymmetry requiring further evaluation.  Diagnostic mammogram done showed persistent distortion in the upper outer right breast without definite sonographic correlate.  No abnormal appearing right axillary lymph nodes.  No persistent mammographic abnormality in the area of the left breast screening study finding.   02/24/2022 Pathology Results   Stereotactic biopsy from the right breast at 9 o'clock position showed invasive well-differentiated ductal adenocarcinoma grade 1 measuring 4 mm in greatest extent, focal intermediate grade DCIS.  Prognostic showed ER 100% positive strong staining PR 0% negative, Ki-67 of 10%, HER2 negative   03/25/2022 Cancer Staging   Staging form: Breast, AJCC 8th Edition - Clinical stage from 03/25/2022: Stage Unknown (cTX, cN0, cM0, G1, ER+, PR-, HER2-) - Signed by Benay Pike, MD on 03/25/2022 Nuclear grade: G1 Histologic grading system: 3 grade system   04/28/2022 Surgery   RIGHT BREAST, LUMPECTOMY: pT1c, pN0, Oncotype DX Breast Recurrence Score is 17  Final pathology showed grade 2 invasive moderately  differentiated adenocarcinoma with focal DCIS.  Tumor measured 1.6 x 0.7 x 0.5 cm, negative margins.  3 lymph nodes negative for carcinoma   04/28/2022 Cancer Staging   Staging form: Breast, AJCC 8th Edition - Pathologic stage from 04/28/2022: Stage IA (pT1c, pN0, cM0, G2, ER+, PR-, HER2-) - Signed by Gardenia Phlegm, NP on 11/28/2022 Stage prefix: Initial diagnosis Histologic grading system: 3 grade system   06/08/2022 - 07/06/2022 Radiation Therapy   06/08/2022 through 07/06/2022 Site Technique Total Dose (Gy) Dose per Fx (Gy) Completed Fx Beam Energies  Breast, Right: Breast_R 3D 40.05/40.05 2.67 15/15 6X  Breast, Right: Breast_R_Bst 3D 10/10 2 5/5 6X, 10X    09/2022 -  Anti-estrogen oral therapy   Anastrozole daily     INTERVAL HISTORY:  Connie May to review her survivorship care plan detailing her treatment course for breast cancer, as well as monitoring long-term side effects of that treatment, education regarding health maintenance, screening, and overall wellness and health promotion.     Overall, Connie May reports feeling quite well she is taking anastrozole daily and is tolerating it moderately well.  She has manageable vaginal dryness and arthralgias which she manages with a heating pad.  She denies any significant concerns today.  REVIEW OF SYSTEMS:  Review of Systems  Constitutional:  Negative for appetite change, chills, fatigue, fever and unexpected weight change.  HENT:   Negative for hearing loss, lump/mass and trouble swallowing.   Eyes:  Negative for eye problems and icterus.  Respiratory:  Negative for chest tightness, cough and shortness of breath.   Cardiovascular:  Negative for chest pain, leg swelling and palpitations.  Gastrointestinal:  Negative for abdominal distention, abdominal pain, constipation, diarrhea, nausea and vomiting.  Endocrine: Negative for  hot flashes.  Genitourinary:  Negative for difficulty urinating.   Musculoskeletal:  Positive for  arthralgias (mild).  Skin:  Negative for itching and rash.  Neurological:  Negative for dizziness, extremity weakness, headaches and numbness.  Hematological:  Negative for adenopathy. Does not bruise/bleed easily.  Psychiatric/Behavioral:  Negative for depression. The patient is not nervous/anxious.   Breast: Denies any new nodularity, masses, tenderness, nipple changes, or nipple discharge.        PAST MEDICAL/SURGICAL HISTORY:  Past Medical History:  Diagnosis Date   Acid reflux    Cancer ([place]) 04/2022   right breast IDC   Depression    Headache    Hyperlipidemia    Nephrolithiasis    history of   Panic attacks    Past Surgical History:  Procedure Laterality Date   APPENDECTOMY     BIOPSY  12/17/2018   Procedure: BIOPSY;  Surgeon: Rogene Houston, MD;  [facility];  Service: Endoscopy;;   BREAST LUMPECTOMY WITH RADIOACTIVE SEED AND SENTINEL LYMPH NODE BIOPSY Right 04/28/2022   Procedure: RIGHT BREAST LUMPECTOMY WITH RADIOACTIVE SEED AND SENTINEL LYMPH NODE BIOPSY;  Surgeon: Erroll Luna, MD;  Location: [place];  Service: General;  Laterality: Right;  GEN & PEC BLOCK   calculus retrieval     x 2    COLONOSCOPY N/A 12/17/2018   Procedure: COLONOSCOPY;  Surgeon: Rogene Houston, MD;  [facility];  Service: Endoscopy;  Laterality: N/A;   open RT knee surgery     POLYPECTOMY  12/17/2018   Procedure: POLYPECTOMY;  Surgeon: Rogene Houston, MD;  [facility];  Service: Endoscopy;;     ALLERGIES:  Allergies  Allergen Reactions   Hydrocodone-Acetaminophen Nausea And Vomiting and Rash     CURRENT MEDICATIONS:  Outpatient Encounter Medications as of 11/28/2022  Medication Sig   anastrozole (ARIMIDEX) 1 MG tablet Take 1 tablet (1 mg total) by mouth daily.   clonazePAM (KLONOPIN) 1 MG tablet Take 1 mg by mouth at bedtime as needed for anxiety. For anxiety   ibuprofen (ADVIL) 800 MG tablet Take 1 tablet (800 mg total) by  mouth every 8 (eight) hours as needed.   NURTEC 75 MG TBDP Take 1 tablet by mouth as needed.   omeprazole (PRILOSEC) 40 MG capsule Take 40 mg by mouth daily.   rosuvastatin (CRESTOR) 5 MG tablet Take 5 mg by mouth daily.   venlafaxine XR (EFFEXOR-XR) 75 MG 24 hr capsule Take 75 mg by mouth daily.   No facility-administered encounter medications on file as of 11/28/2022.     ONCOLOGIC FAMILY HISTORY:  Family History  Problem Relation Age of Onset   Stroke Mother    Depression Father    Prostate cancer Father    Lung cancer Father    Bipolar disorder Sister        ]   Hepatitis Sister    Stroke Maternal Grandmother    Colon cancer Neg Hx       SOCIAL HISTORY:  Social History   Socioeconomic History   Marital status: Married    Spouse name: Not on file   Number of children: 2    Years of education: Not on file   Highest education level: Not on file  Occupational History    Employer: [employer]  Tobacco Use   Smoking status: Never   Smokeless tobacco: Never  Vaping Use   Vaping Use: Never used  Substance and Sexual Activity  Alcohol use: Not Currently   Drug use: Not Currently    Types: Marijuana   Sexual activity: Not Currently    Birth control/protection: Post-menopausal  Other Topics Concern   Not on file  Social History Narrative   No regular exercise.    Social Determinants of Health   Financial Resource Strain: Not on file  Food Insecurity: Not on file  Transportation Needs: Not on file  Physical Activity: Not on file  Stress: Not on file  Social Connections: Not on file  Intimate Partner Violence: Not on file     OBSERVATIONS/OBJECTIVE:  Patient sounds well she is in no apparent distress.  Mood and behavior are normal speech is normal.  LABORATORY DATA:  None for this visit.  DIAGNOSTIC IMAGING:  None for this visit.      ASSESSMENT AND PLAN:  Ms.. Connie May is a pleasant 67 y.o. female with Stage 1A right breast invasive ductal  carcinoma, ER+/PR+/HER2-, diagnosed in April 2023, treated with lumpectomy, adjuvant radiation therapy, and anti-estrogen therapy with anastrozole beginning in December 2023.  She presents to the Survivorship Clinic for our initial meeting and routine follow-up post-completion of treatment for breast cancer.    1. Stage 1A right breast cancer:  Connie May is continuing to recover from definitive treatment for breast cancer. She will follow-up with her medical oncologist, Dr. Chryl Heck  in June 2024 with history and physical exam per surveillance protocol.  She will continue her anti-estrogen therapy with anastrozole. Thus far, she is tolerating the anastrozole well, with minimal side effects. Her mammogram is due April 2024; orders placed today. Today, a comprehensive survivorship care plan and treatment summary was reviewed with the patient today detailing her breast cancer diagnosis, treatment course, potential late/long-term effects of treatment, appropriate follow-up care with recommendations for the future, and patient education resources.  A copy of this summary, along with a letter will be sent to the patient's primary care provider via mail/fax/In Basket message after today's visit.    2. Bone health:  Given Connie May's age/history of breast cancer and her current treatment regimen including anti-estrogen therapy with anastrozole, she is at risk for bone demineralization.  Her last DEXA scan was in January 2024 which indicated mild osteoporosis with a T-score -2.7 in the [facility] spine.  At that time she is optimized her calcium and vitamin D intake and we have reviewed this in detail today.  She is due for repeat bone density testing in January 2026.  We discussed that if worse she will likely to consider bisphosphonate therapy to help build her bones.  She was given education on specific activities to promote bone health.  3. Cancer screening:  Due to Ms. Anthis's history and her age, she should receive  screening for skin cancers, colon cancer, and gynecologic cancers.  The information and recommendations are listed on the patient's comprehensive care plan/treatment summary and were reviewed in detail with the patient.    4. Health maintenance and wellness promotion: Ms. Oquin was encouraged to consume 5-7 servings of fruits and vegetables per day. We reviewed the "Nutrition Rainbow" handout.  She was also encouraged to engage in moderate to vigorous exercise for 30 minutes per day most days of the week. She was instructed to limit her alcohol consumption and continue to abstain from tobacco use.     5. Support services/counseling: It is not uncommon for this period of the patient's cancer care trajectory to be one of many emotions and stressors.   She  was given information regarding our available services and encouraged to contact me with any questions or for help enrolling in any of our support group/programs.    Follow up instructions:    -Return to cancer center 03/2023 for f/u with Dr. Chryl Heck  -Mammogram due in 01/2023 -Bone density 10/2024 -She is welcome to return back to the Survivorship Clinic at any time; no additional follow-up needed at this time.  -Consider referral back to survivorship as a long-term survivor for continued surveillance  The patient was provided an opportunity to ask questions and all were answered. The patient agreed with the plan and demonstrated an understanding of the instructions.   The patient was advised to call back or seek an in-person evaluation if the symptoms worsen or if the condition fails to improve as anticipated.   I provided 25 minutes of non face-to-face telephone visit time during this encounter, and > 50% was spent counseling as documented under my assessment & plan.  Wilber Bihari, NP 11/28/22 12:22 PM Medical Oncology and Hematology [facility] [place], [place] [address] Tel. [phone]    Fax. [phone]

## 2022-12-12 ENCOUNTER — Other Ambulatory Visit (HOSPITAL_COMMUNITY): Payer: Self-pay | Admitting: Adult Health

## 2022-12-12 DIAGNOSIS — C50911 Malignant neoplasm of unspecified site of right female breast: Secondary | ICD-10-CM

## 2022-12-12 DIAGNOSIS — Z171 Estrogen receptor negative status [ER-]: Secondary | ICD-10-CM

## 2023-02-21 ENCOUNTER — Ambulatory Visit (HOSPITAL_COMMUNITY)
Admission: RE | Admit: 2023-02-21 | Discharge: 2023-02-21 | Disposition: A | Discharge status: Home / Self Care | Payer: Medicare Other | Source: Ambulatory Visit | Attending: Adult Health | Admitting: Adult Health

## 2023-02-21 ENCOUNTER — Encounter (HOSPITAL_COMMUNITY): Payer: Self-pay

## 2023-02-21 DIAGNOSIS — C50411 Malignant neoplasm of upper-outer quadrant of right female breast: Secondary | ICD-10-CM | POA: Diagnosis not present

## 2023-02-21 DIAGNOSIS — Z170 Estrogen receptor positive status [ER+]: Secondary | ICD-10-CM | POA: Diagnosis not present

## 2023-02-21 DIAGNOSIS — Z171 Estrogen receptor negative status [ER-]: Secondary | ICD-10-CM

## 2023-02-21 DIAGNOSIS — C50911 Malignant neoplasm of unspecified site of right female breast: Secondary | ICD-10-CM

## 2023-02-21 DIAGNOSIS — Z853 Personal history of malignant neoplasm of breast: Secondary | ICD-10-CM | POA: Diagnosis not present

## 2023-03-06 ENCOUNTER — Inpatient Hospital Stay: Payer: Medicare Other | Attending: Hematology and Oncology | Admitting: Hematology and Oncology

## 2023-03-06 VITALS — BP 126/66 | HR 77 | Temp 97.9°F | Resp 14 | Ht 61.0 in | Wt 140.8 lb

## 2023-03-06 DIAGNOSIS — M810 Age-related osteoporosis without current pathological fracture: Secondary | ICD-10-CM | POA: Insufficient documentation

## 2023-03-06 DIAGNOSIS — R5383 Other fatigue: Secondary | ICD-10-CM | POA: Diagnosis not present

## 2023-03-06 DIAGNOSIS — Z170 Estrogen receptor positive status [ER+]: Secondary | ICD-10-CM | POA: Diagnosis not present

## 2023-03-06 DIAGNOSIS — C50411 Malignant neoplasm of upper-outer quadrant of right female breast: Secondary | ICD-10-CM | POA: Insufficient documentation

## 2023-03-06 DIAGNOSIS — Z79811 Long term (current) use of aromatase inhibitors: Secondary | ICD-10-CM | POA: Insufficient documentation

## 2023-03-06 DIAGNOSIS — Z7189 Other specified counseling: Secondary | ICD-10-CM | POA: Diagnosis not present

## 2023-03-06 NOTE — Progress Notes (Signed)
[facility] CONSULT NOTE  Patient Care Team: Benita Stabile, MD as PCP - General (Internal Medicine) Jena Gauss, Gerrit Friends, MD as Consulting Physician (Gastroenterology) Rachel Moulds, MD as Consulting Physician (Hematology and Oncology) Lonie Peak, MD as Attending Physician (Radiation Oncology) Harriette Bouillon, MD as Consulting Physician (General Surgery)  CHIEF COMPLAINTS/PURPOSE OF CONSULTATION:  Newly diagnosed breast cancer  HISTORY OF PRESENTING ILLNESS:  Connie May 67 y.o. female is here because of recent diagnosis of right breast cancer  I reviewed her records extensively and collaborated the history with the patient.  SUMMARY OF ONCOLOGIC HISTORY: Oncology History  Malignant neoplasm of upper-outer quadrant of right breast in female, estrogen receptor positive (HCC)  01/20/2022 Mammogram   Mammogram done on April 20 showed right breast focal asymmetry requiring further evaluation of a left breast asymmetry requiring further evaluation.  Diagnostic mammogram done showed persistent distortion in the upper outer right breast without definite sonographic correlate.  No abnormal appearing right axillary lymph nodes.  No persistent mammographic abnormality in the area of the left breast screening study finding.   02/24/2022 Pathology Results   Stereotactic biopsy from the right breast at 9 o'clock position showed invasive well-differentiated ductal adenocarcinoma grade 1 measuring 4 mm in greatest extent, focal intermediate grade DCIS.  Prognostic showed ER 100% positive strong staining PR 0% negative, Ki-67 of 10%, HER2 negative   03/25/2022 Cancer Staging   Staging form: Breast, AJCC 8th Edition - Clinical stage from 03/25/2022: Stage Unknown (cTX, cN0, cM0, G1, ER+, PR-, HER2-) - Signed by Rachel Moulds, MD on 03/25/2022 Nuclear grade: G1 Histologic grading system: 3 grade system   04/28/2022 Surgery   RIGHT BREAST, LUMPECTOMY: pT1c, pN0, Oncotype DX Breast  Recurrence Score is 17  Final pathology showed grade 2 invasive moderately differentiated adenocarcinoma with focal DCIS.  Tumor measured 1.6 x 0.7 x 0.5 cm, negative margins.  3 lymph nodes negative for carcinoma   04/28/2022 Cancer Staging   Staging form: Breast, AJCC 8th Edition - Pathologic stage from 04/28/2022: Stage IA (pT1c, pN0, cM0, G2, ER+, PR-, HER2-) - Signed by Loa Socks, NP on 11/28/2022 Stage prefix: Initial diagnosis Histologic grading system: 3 grade system   05/17/2022 Oncotype testing   17/5%   06/08/2022 - 07/06/2022 Radiation Therapy   06/08/2022 through 07/06/2022 Site Technique Total Dose (Gy) Dose per Fx (Gy) Completed Fx Beam Energies  Breast, Right: Breast_R 3D 40.05/40.05 2.67 15/15 6X  Breast, Right: Breast_R_Bst 3D 10/10 2 5/5 6X, 10X    09/2022 -  Anti-estrogen oral therapy   Anastrozole daily    Ms. Warbington is here on anastrozole. Since her last visit here, she is doing ok on anastrozole except for ongoing hot flashes which are annoying.  She has also been dealing with a lot of stress with the passing of both her mother and father in the past couple years.  She is working on their estate and having things done.  She appears to be dealing with a lot of grief but does not want to really talk to a grief counselor.  She is very worried however about keeping it all in and getting overly stressed about it.  She is considering talking to a therapist.  Other than the emotional issues as well as ongoing hot flashes with anastrozole, rest of her health has been doing okay.  She denies any breast changes today.  MEDICAL HISTORY:  Past Medical History:  Diagnosis Date   Acid reflux    Cancer (HCC)  04/2022   right breast IDC   Depression    Headache    Hyperlipidemia    Nephrolithiasis    history of   Panic attacks     SURGICAL HISTORY: Past Surgical History:  Procedure Laterality Date   APPENDECTOMY     BIOPSY  12/17/2018   Procedure: BIOPSY;   Surgeon: Malissa Hippo, MD;  Location: [facility];  Service: Endoscopy;;   BREAST LUMPECTOMY WITH RADIOACTIVE SEED AND SENTINEL LYMPH NODE BIOPSY Right 04/28/2022   Procedure: RIGHT BREAST LUMPECTOMY WITH RADIOACTIVE SEED AND SENTINEL LYMPH NODE BIOPSY;  Surgeon: Harriette Bouillon, MD;  Location: [facility];  Service: General;  Laterality: Right;  GEN & PEC BLOCK   calculus retrieval     x 2    COLONOSCOPY N/A 12/17/2018   Procedure: COLONOSCOPY;  Surgeon: Malissa Hippo, MD;  Location: [facility];  Service: Endoscopy;  Laterality: N/A;   open RT knee surgery     POLYPECTOMY  12/17/2018   Procedure: POLYPECTOMY;  Surgeon: Malissa Hippo, MD;  Location: [facility];  Service: Endoscopy;;    SOCIAL HISTORY: Social History   Socioeconomic History   Marital status: Married    Spouse name: Not on file   Number of children: 2    Years of education: Not on file   Highest education level: Not on file  Occupational History    Employer: [employer]  Tobacco Use   Smoking status: Never   Smokeless tobacco: Never  Vaping Use   Vaping Use: Never used  Substance and Sexual Activity   Alcohol use: Not Currently   Drug use: Not Currently    Types: Marijuana   Sexual activity: Not Currently    Birth control/protection: Post-menopausal  Other Topics Concern   Not on file  Social History Narrative   No regular exercise.    Social Determinants of Health   Financial Resource Strain: Not on file  Food Insecurity: Not on file  Transportation Needs: Not on file  Physical Activity: Not on file  Stress: Not on file  Social Connections: Not on file  Intimate Partner Violence: Not on file    FAMILY HISTORY: Family History  Problem Relation Age of Onset   Stroke Mother    Depression Father    Prostate cancer Father    Lung cancer Father    Bipolar disorder Sister        ]   Hepatitis Sister    Stroke Maternal Grandmother    Colon cancer Neg Hx      ALLERGIES:  is allergic to hydrocodone-acetaminophen.  MEDICATIONS:  Current Outpatient Medications  Medication Sig Dispense Refill   anastrozole (ARIMIDEX) 1 MG tablet Take 1 tablet (1 mg total) by mouth daily. 90 tablet 3   clonazePAM (KLONOPIN) 1 MG tablet Take 1 mg by mouth at bedtime as needed for anxiety. For anxiety     ibuprofen (ADVIL) 800 MG tablet Take 1 tablet (800 mg total) by mouth every 8 (eight) hours as needed. 30 tablet 0   NURTEC 75 MG TBDP Take 1 tablet by mouth as needed.     omeprazole (PRILOSEC) 40 MG capsule Take 40 mg by mouth daily.     rosuvastatin (CRESTOR) 5 MG tablet Take 5 mg by mouth daily.     venlafaxine XR (EFFEXOR-XR) 75 MG 24 hr capsule Take 75 mg by mouth daily.     No current facility-administered medications for this visit.    REVIEW  OF SYSTEMS:   Constitutional: Denies fevers, chills or abnormal night sweats Eyes: Denies blurriness of vision, double vision or watery eyes Ears, nose, mouth, throat, and face: Denies mucositis or sore throat Respiratory: Denies cough, dyspnea or wheezes Cardiovascular: Denies palpitation, chest discomfort or lower extremity swelling Gastrointestinal:  Denies nausea, heartburn or change in bowel habits Skin: Denies abnormal skin rashes Lymphatics: Denies new lymphadenopathy or easy bruising Neurological:Denies numbness, tingling or new weaknesses Behavioral/Psych: Mood is stable, no new changes  Breast: Denies any palpable lumps or discharge All other systems were reviewed with the patient and are negative.  PHYSICAL EXAMINATION: ECOG PERFORMANCE STATUS: 0 - Asymptomatic  Vitals:   03/06/23 1134  BP: 126/66  Pulse: 77  Resp: 14  Temp: 97.9 F (36.6 C)  SpO2: 99%    Filed Weights   03/06/23 1134  Weight: 140 lb 12.8 oz (63.9 kg)   General appearance: Alert, oriented and in no acute distress Breast: Bilateral breasts inspected and palpated.  No palpable masses or regional adenopathy No LE  edema  LABORATORY DATA:  I have reviewed the data as listed Lab Results  Component Value Date   WBC 6.6 12/13/2011   HGB 14.4 12/13/2011   HCT 43.2 12/13/2011   MCV 89.1 12/13/2011   PLT 243 12/13/2011   Lab Results  Component Value Date   NA 141 12/13/2011   K 3.6 12/13/2011   CL 104 12/13/2011   CO2 26 12/13/2011    RADIOGRAPHIC STUDIES: I have personally reviewed the radiological reports and agreed with the findings in the report.  ASSESSMENT AND PLAN:  Malignant neoplasm of upper-outer quadrant of right breast in female, estrogen receptor positive (HCC) This is a very pleasant 67 year old postmenopausal female patient with no significant past medical history referred to breast oncology given new diagnosis of right breast invasive ductal carcinoma status post right breast lumpectomy with final pathology showing grade 2 invasive moderately differentiated adenocarcinoma with focal DCIS, 3 lymph nodes negative for carcinoma.  Oncotype DX score resulted at 17, distant risk of recurrence at 9 years is about 5% and no benefit of chemo in this age group.  She is here after completing adjuvant radiation.   She is now on anastrozole.  She has been tolerating it reasonably well except for some vasomotor symptoms.  She has also noticed more emotional distress and extreme fatigue.  She however is not clear if this is from the medication or because of loss of her mom and dad for whom she was the caregiver.  She has not quite process the grief yet.  She is considering talking to a therapist but she will keep [place] informed if she decides to proceed with this.  She is also going to talk to her PCP about this.  No concerns for breast cancer recurrence at this time.  She will continue on anastrozole. We have also reviewed the bone density results which showed osteoporosis.  We have discussed about treatment of osteoporosis including bisphosphonates, weightbearing exercises, calcium and vitamin D  supplementation.  She wants to talk to her PCP as well as about this.  If she wants to proceed with the bisphosphonate, we will need formal dental clearance, she is apparently anticipating some dental procedures.  We unfortunately cannot do the once a year zoledronic acid for osteoporosis in our clinic.  We can certainly do Zometa every 6 months or oral bisphosphonates.  Once again with all that is going on, she will get back to [place] regarding this.  Total time spent: 40 minutes involving history, review of records, physical exam, counseling and coordination of care All questions were answered. The patient knows to call the clinic with any problems, questions or concerns.    Rachel Moulds, MD 03/06/23

## 2023-03-06 NOTE — Assessment & Plan Note (Addendum)
This is a very pleasant 67 year old postmenopausal female patient with no significant past medical history referred to breast oncology given new diagnosis of right breast invasive ductal carcinoma status post right breast lumpectomy with final pathology showing grade 2 invasive moderately differentiated adenocarcinoma with focal DCIS, 3 lymph nodes negative for carcinoma.  Oncotype DX score resulted at 17, distant risk of recurrence at 9 years is about 5% and no benefit of chemo in this age group.  She is here after completing adjuvant radiation.   She is now on anastrozole.  She has been tolerating it reasonably well except for some vasomotor symptoms.  She has also noticed more emotional distress and extreme fatigue.  She however is not clear if this is from the medication or because of loss of her mom and dad for whom she was the caregiver.  She has not quite process the grief yet.  She is considering talking to a therapist but she will keep [place] informed if she decides to proceed with this.  She is also going to talk to her PCP about this.  No concerns for breast cancer recurrence at this time.  She will continue on anastrozole. We have also reviewed the bone density results which showed osteoporosis.  We have discussed about treatment of osteoporosis including bisphosphonates, weightbearing exercises, calcium and vitamin D supplementation.  She wants to talk to her PCP as well as about this.  If she wants to proceed with the bisphosphonate, we will need formal dental clearance, she is apparently anticipating some dental procedures.  We unfortunately cannot do the once a year zoledronic acid for osteoporosis in our clinic.  We can certainly do Zometa every 6 months or oral bisphosphonates.  Once again with all that is going on, she will get back to [place] regarding this.

## 2023-03-17 DIAGNOSIS — E785 Hyperlipidemia, unspecified: Secondary | ICD-10-CM | POA: Diagnosis not present

## 2023-03-17 DIAGNOSIS — Z139 Encounter for screening, unspecified: Secondary | ICD-10-CM | POA: Diagnosis not present

## 2023-03-17 DIAGNOSIS — R7301 Impaired fasting glucose: Secondary | ICD-10-CM | POA: Diagnosis not present

## 2023-03-20 DIAGNOSIS — R7303 Prediabetes: Secondary | ICD-10-CM | POA: Insufficient documentation

## 2023-03-21 DIAGNOSIS — Z0000 Encounter for general adult medical examination without abnormal findings: Secondary | ICD-10-CM | POA: Diagnosis not present

## 2023-03-21 DIAGNOSIS — E785 Hyperlipidemia, unspecified: Secondary | ICD-10-CM | POA: Diagnosis not present

## 2023-03-21 DIAGNOSIS — M810 Age-related osteoporosis without current pathological fracture: Secondary | ICD-10-CM | POA: Insufficient documentation

## 2023-03-21 DIAGNOSIS — G4759 Other parasomnia: Secondary | ICD-10-CM | POA: Diagnosis not present

## 2023-03-21 DIAGNOSIS — R7303 Prediabetes: Secondary | ICD-10-CM | POA: Diagnosis not present

## 2023-03-21 DIAGNOSIS — Z23 Encounter for immunization: Secondary | ICD-10-CM | POA: Diagnosis not present

## 2023-03-21 DIAGNOSIS — G43909 Migraine, unspecified, not intractable, without status migrainosus: Secondary | ICD-10-CM | POA: Diagnosis not present

## 2023-03-21 DIAGNOSIS — C50411 Malignant neoplasm of upper-outer quadrant of right female breast: Secondary | ICD-10-CM | POA: Diagnosis not present

## 2023-03-21 DIAGNOSIS — Z0001 Encounter for general adult medical examination with abnormal findings: Secondary | ICD-10-CM | POA: Diagnosis not present

## 2023-03-21 DIAGNOSIS — G478 Other sleep disorders: Secondary | ICD-10-CM | POA: Insufficient documentation

## 2023-03-21 DIAGNOSIS — M5030 Other cervical disc degeneration, unspecified cervical region: Secondary | ICD-10-CM | POA: Diagnosis not present

## 2023-03-21 DIAGNOSIS — K449 Diaphragmatic hernia without obstruction or gangrene: Secondary | ICD-10-CM | POA: Diagnosis not present

## 2023-06-22 ENCOUNTER — Telehealth: Payer: Self-pay | Admitting: *Deleted

## 2023-06-22 NOTE — Telephone Encounter (Signed)
Late entry.  This RN spoke with pt earlier this month per her call and concern with bone density - on anastrozole and unable to tolerate bisphosphonate therapy.  She states noted joint stiffness and some impairment in her ADL's since being on the anastrozole - but her main issue is more with bone density at -2.7 per scan done in Jan 2024.  She tried oral bisphosphonate drugs and had severe side effects of discomfort and bone pain that she could not function well.  Per discussion- plan is to hold the anastrozole at this time and to see MD for discussion for appropriate change in therapy.  Appt made by this RN for 06/26/2023.

## 2023-06-26 ENCOUNTER — Encounter: Payer: Self-pay | Admitting: Hematology and Oncology

## 2023-06-26 ENCOUNTER — Inpatient Hospital Stay: Payer: Medicare Other | Attending: Hematology and Oncology | Admitting: Hematology and Oncology

## 2023-06-26 VITALS — BP 120/68 | HR 78 | Temp 98.1°F | Resp 17 | Ht 61.0 in | Wt 143.2 lb

## 2023-06-26 DIAGNOSIS — N951 Menopausal and female climacteric states: Secondary | ICD-10-CM | POA: Diagnosis not present

## 2023-06-26 DIAGNOSIS — C50411 Malignant neoplasm of upper-outer quadrant of right female breast: Secondary | ICD-10-CM | POA: Insufficient documentation

## 2023-06-26 DIAGNOSIS — M810 Age-related osteoporosis without current pathological fracture: Secondary | ICD-10-CM | POA: Insufficient documentation

## 2023-06-26 DIAGNOSIS — Z801 Family history of malignant neoplasm of trachea, bronchus and lung: Secondary | ICD-10-CM | POA: Diagnosis not present

## 2023-06-26 DIAGNOSIS — Z170 Estrogen receptor positive status [ER+]: Secondary | ICD-10-CM | POA: Diagnosis not present

## 2023-06-26 DIAGNOSIS — Z79811 Long term (current) use of aromatase inhibitors: Secondary | ICD-10-CM | POA: Diagnosis not present

## 2023-06-26 NOTE — Progress Notes (Signed)
[facility] CONSULT NOTE  Patient Care Team: Benita Stabile, MD as PCP - General (Internal Medicine) Jena Gauss, Gerrit Friends, MD as Consulting Physician (Gastroenterology) Rachel Moulds, MD as Consulting Physician (Hematology and Oncology) Lonie Peak, MD as Attending Physician (Radiation Oncology) Harriette Bouillon, MD as Consulting Physician (General Surgery)  CHIEF COMPLAINTS/PURPOSE OF CONSULTATION:  Newly diagnosed breast cancer  HISTORY OF PRESENTING ILLNESS:  Connie May 67 y.o. female is here because of recent diagnosis of right breast cancer  I reviewed her records extensively and collaborated the history with the patient.  SUMMARY OF ONCOLOGIC HISTORY: Oncology History  Malignant neoplasm of upper-outer quadrant of right breast in female, estrogen receptor positive (HCC)  01/20/2022 Mammogram   Mammogram done on April 20 showed right breast focal asymmetry requiring further evaluation of a left breast asymmetry requiring further evaluation.  Diagnostic mammogram done showed persistent distortion in the upper outer right breast without definite sonographic correlate.  No abnormal appearing right axillary lymph nodes.  No persistent mammographic abnormality in the area of the left breast screening study finding.   02/24/2022 Pathology Results   Stereotactic biopsy from the right breast at 9 o'clock position showed invasive well-differentiated ductal adenocarcinoma grade 1 measuring 4 mm in greatest extent, focal intermediate grade DCIS.  Prognostic showed ER 100% positive strong staining PR 0% negative, Ki-67 of 10%, HER2 negative   03/25/2022 Cancer Staging   Staging form: Breast, AJCC 8th Edition - Clinical stage from 03/25/2022: Stage Unknown (cTX, cN0, cM0, G1, ER+, PR-, HER2-) - Signed by Rachel Moulds, MD on 03/25/2022 Nuclear grade: G1 Histologic grading system: 3 grade system   04/28/2022 Surgery   RIGHT BREAST, LUMPECTOMY: pT1c, pN0, Oncotype DX Breast  Recurrence Score is 17  Final pathology showed grade 2 invasive moderately differentiated adenocarcinoma with focal DCIS.  Tumor measured 1.6 x 0.7 x 0.5 cm, negative margins.  3 lymph nodes negative for carcinoma   04/28/2022 Cancer Staging   Staging form: Breast, AJCC 8th Edition - Pathologic stage from 04/28/2022: Stage IA (pT1c, pN0, cM0, G2, ER+, PR-, HER2-) - Signed by Loa Socks, NP on 11/28/2022 Stage prefix: Initial diagnosis Histologic grading system: 3 grade system   05/17/2022 Oncotype testing   17/5%   06/08/2022 - 07/06/2022 Radiation Therapy   06/08/2022 through 07/06/2022 Site Technique Total Dose (Gy) Dose per Fx (Gy) Completed Fx Beam Energies  Breast, Right: Breast_R 3D 40.05/40.05 2.67 15/15 6X  Breast, Right: Breast_R_Bst 3D 10/10 2 5/5 6X, 10X     09/2022 -  Anti-estrogen oral therapy   Anastrozole daily    Discussed the use of AI scribe software for clinical note transcription with the patient, who gave verbal consent to proceed.  History of Present Illness    The patient, with a history of osteoporosis and breast cancer, presents with ongoing hot flashes and bone aches. The patient reports that the bone aches seem to be associated with the use of Fosamax, an osteoporosis medication, and have improved since discontinuing the medication about a month ago. The patient continues to take anastrozole, a medication for breast cancer, and reports that the hot flashes are still present but not unbearable. The patient also mentions some unusual occurrences at home, which she attributes to the spirit of their deceased mother, but does not express any distress or concern about these experiences.    MEDICAL HISTORY:  Past Medical History:  Diagnosis Date   Acid reflux    Cancer (HCC) 04/2022   right breast  IDC   Depression    Headache    Hyperlipidemia    Nephrolithiasis    history of   Panic attacks     SURGICAL HISTORY: Past Surgical History:  Procedure  Laterality Date   APPENDECTOMY     BIOPSY  12/17/2018   Procedure: BIOPSY;  Surgeon: Malissa Hippo, MD;  Location: [facility];  Service: Endoscopy;;   BREAST LUMPECTOMY WITH RADIOACTIVE SEED AND SENTINEL LYMPH NODE BIOPSY Right 04/28/2022   Procedure: RIGHT BREAST LUMPECTOMY WITH RADIOACTIVE SEED AND SENTINEL LYMPH NODE BIOPSY;  Surgeon: Harriette Bouillon, MD;  Location: [facility];  Service: General;  Laterality: Right;  GEN & PEC BLOCK   calculus retrieval     x 2    COLONOSCOPY N/A 12/17/2018   Procedure: COLONOSCOPY;  Surgeon: Malissa Hippo, MD;  Location: [facility];  Service: Endoscopy;  Laterality: N/A;   open RT knee surgery     POLYPECTOMY  12/17/2018   Procedure: POLYPECTOMY;  Surgeon: Malissa Hippo, MD;  [facility]: [facility];  Service: Endoscopy;;    SOCIAL HISTORY: Social History   Socioeconomic History   Marital status: Married    Spouse name: Not on file   Number of children: 2    Years of education: Not on file   Highest education level: Not on file  Occupational History    Employer: [employer]  Tobacco Use   Smoking status: Never   Smokeless tobacco: Never  Vaping Use   Vaping status: Never Used  Substance and Sexual Activity   Alcohol use: Not Currently   Drug use: Not Currently    Types: Marijuana   Sexual activity: Not Currently    Birth control/protection: Post-menopausal  Other Topics Concern   Not on file  Social History Narrative   No regular exercise.    Social Determinants of Health   Financial Resource Strain: Not on file  Food Insecurity: Not on file  Transportation Needs: Not on file  Physical Activity: Not on file  Stress: Not on file  Social Connections: Not on file  Intimate Partner Violence: Not on file    FAMILY HISTORY: Family History  Problem Relation Age of Onset   Stroke Mother    Depression Father    Prostate cancer Father    Lung cancer Father    Bipolar disorder Sister        ]    Hepatitis Sister    Stroke Maternal Grandmother    Colon cancer Neg Hx     ALLERGIES:  is allergic to hydrocodone-acetaminophen.  MEDICATIONS:  Current Outpatient Medications  Medication Sig Dispense Refill   anastrozole (ARIMIDEX) 1 MG tablet Take 1 tablet (1 mg total) by mouth daily. 90 tablet 3   clonazePAM (KLONOPIN) 1 MG tablet Take 1 mg by mouth at bedtime as needed for anxiety. For anxiety     ibuprofen (ADVIL) 800 MG tablet Take 1 tablet (800 mg total) by mouth every 8 (eight) hours as needed. 30 tablet 0   NURTEC 75 MG TBDP Take 1 tablet by mouth as needed.     omeprazole (PRILOSEC) 40 MG capsule Take 40 mg by mouth daily.     rosuvastatin (CRESTOR) 5 MG tablet Take 5 mg by mouth daily.     venlafaxine XR (EFFEXOR-XR) 75 MG 24 hr capsule Take 75 mg by mouth daily.     No current facility-administered medications for this visit.    REVIEW OF SYSTEMS:   Constitutional:  Denies fevers, chills or abnormal night sweats Eyes: Denies blurriness of vision, double vision or watery eyes Ears, nose, mouth, throat, and face: Denies mucositis or sore throat Respiratory: Denies cough, dyspnea or wheezes Cardiovascular: Denies palpitation, chest discomfort or lower extremity swelling Gastrointestinal:  Denies nausea, heartburn or change in bowel habits Skin: Denies abnormal skin rashes Lymphatics: Denies new lymphadenopathy or easy bruising Neurological:Denies numbness, tingling or new weaknesses Behavioral/Psych: Mood is stable, no new changes  Breast: Denies any palpable lumps or discharge All other systems were reviewed with the patient and are negative.  PHYSICAL EXAMINATION: ECOG PERFORMANCE STATUS: 0 - Asymptomatic  Vitals:   06/26/23 1024  BP: 120/68  Pulse: 78  Resp: 17  Temp: 98.1 F (36.7 C)  SpO2: 100%    Filed Weights   06/26/23 1024  Weight: 143 lb 3.2 oz (65 kg)   General appearance: Alert, oriented and in no acute distress Rest of the PE deferred in lieu  of counseling.  LABORATORY DATA:  I have reviewed the data as listed Lab Results  Component Value Date   WBC 6.6 12/13/2011   HGB 14.4 12/13/2011   HCT 43.2 12/13/2011   MCV 89.1 12/13/2011   PLT 243 12/13/2011   Lab Results  Component Value Date   NA 141 12/13/2011   K 3.6 12/13/2011   CL 104 12/13/2011   CO2 26 12/13/2011    RADIOGRAPHIC STUDIES: I have personally reviewed the radiological reports and agreed with the findings in the report.  ASSESSMENT AND PLAN:  Malignant neoplasm of upper-outer quadrant of right breast in female, estrogen receptor positive (HCC) This is a very pleasant 67 year old postmenopausal female patient with no significant past medical history referred to breast oncology given new diagnosis of right breast invasive ductal carcinoma status post right breast lumpectomy with final pathology showing grade 2 invasive moderately differentiated adenocarcinoma with focal DCIS, 3 lymph nodes negative for carcinoma.  Oncotype DX score resulted at 17, distant risk of recurrence at 9 years is about 5% and no benefit of chemo in this age group.  She  completed adjuvant radiation, now on anastrozole.  Assessment and Plan    Menopausal Symptoms Persistent hot flashes, not severe enough to warrant medication at this time. -Continue monitoring symptoms.  Osteoporosis Discontinued Fosamax due to bone pain. Pain has improved since discontinuation. Patient open to trying IV bisphosphonate. -Obtain dental clearance for IV bisphosphonate therapy. -If clearance obtained and no contraindications, plan to start Zometa (IV bisphosphonate) every six months.  Breast Cancer On Anastrozole, no current issues. -Continue Anastrozole as prescribed. -Continue regular mammogram screenings.  General Health Maintenance -Continue regular dental check-ups.        Total time spent: 30 minutes involving history, review of records, physical exam, counseling and coordination of  care All questions were answered. The patient knows to call the clinic with any problems, questions or concerns.    Rachel Moulds, MD 06/26/23

## 2023-06-26 NOTE — Assessment & Plan Note (Addendum)
This is a very pleasant 67 year old postmenopausal female patient with no significant past medical history referred to breast oncology given new diagnosis of right breast invasive ductal carcinoma status post right breast lumpectomy with final pathology showing grade 2 invasive moderately differentiated adenocarcinoma with focal DCIS, 3 lymph nodes negative for carcinoma.  Oncotype DX score resulted at 17, distant risk of recurrence at 9 years is about 5% and no benefit of chemo in this age group.  She  completed adjuvant radiation, now on anastrozole.  Assessment and Plan    Menopausal Symptoms Persistent hot flashes, not severe enough to warrant medication at this time. -Continue monitoring symptoms.  Osteoporosis Discontinued Fosamax due to bone pain. Pain has improved since discontinuation. Patient open to trying IV bisphosphonate. -Obtain dental clearance for IV bisphosphonate therapy. -If clearance obtained and no contraindications, plan to start Zometa (IV bisphosphonate) every six months.  Breast Cancer On Anastrozole, no current issues. -Continue Anastrozole as prescribed. -Continue regular mammogram screenings. -Most recent mammogram with no concerns.  General Health Maintenance -Continue regular dental check-ups.

## 2023-07-14 DIAGNOSIS — M79602 Pain in left arm: Secondary | ICD-10-CM | POA: Diagnosis not present

## 2023-07-14 DIAGNOSIS — Z23 Encounter for immunization: Secondary | ICD-10-CM | POA: Diagnosis not present

## 2023-07-18 ENCOUNTER — Telehealth: Payer: Self-pay

## 2023-07-18 NOTE — Telephone Encounter (Signed)
Attempted to call pt to obtain contact information for her dentist so we may submit request for clearance so pt may start biphos therapy. LVM for call back.

## 2023-07-18 NOTE — Progress Notes (Signed)
Attempted to call patient to obtain dentist name so we can submit request for dental clearance so pt can start Zometa. LVM for call back with info.

## 2023-07-19 ENCOUNTER — Telehealth: Payer: Self-pay

## 2023-07-19 NOTE — Telephone Encounter (Signed)
-----   Message from Walland Iruku sent at 07/19/2023 10:25 AM EDT ----- Regarding: RE: Zometa orders We will have to await dental clearance. Once dental clearance is in, if one of you all can send a message that we got it, then I order bisphosphonates.  Thanks, ----- Message ----- From: De Blanch, LPN Sent: [id number]   8:34 AM EDT To: Rachel Moulds, MD; [facility] Subject: Zometa orders                                  Dr Al Pimple,  Pt has decided to move forward with Zometa. Could you please place orders so we can obtain auth and schedule? Please let me know when orders are in so I can facilitate PA and scheduling process. Sending dental clearance request to her dentist now.  Thanks, LB

## 2023-07-19 NOTE — Telephone Encounter (Signed)
Dental clearance request faxed to Dr Berenice Bouton at [phone].  Message sent to MD Iruku to initiate orders for Zometa so we can obtain PA and schedule pt.

## 2023-07-19 NOTE — Telephone Encounter (Signed)
Dental clearance received from Dr Berenice Bouton, stating to postpone starting Zometa until after 08/03/23, as pt has appt that day for cleaning. Dr Sunny Schlein recommends cleaning 3 months after starting Zometa to prevent peridontal loss. This form provided to MD.

## 2023-07-20 ENCOUNTER — Other Ambulatory Visit: Payer: Self-pay | Admitting: Hematology and Oncology

## 2023-07-20 NOTE — Progress Notes (Signed)
Dental clearance form received, recommendation was to start after 10/31 Zometa plan placed.  Connie May

## 2023-07-21 ENCOUNTER — Other Ambulatory Visit (INDEPENDENT_AMBULATORY_CARE_PROVIDER_SITE_OTHER): Payer: Medicare Other

## 2023-07-21 ENCOUNTER — Ambulatory Visit: Payer: Medicare Other | Admitting: Orthopedic Surgery

## 2023-07-21 ENCOUNTER — Telehealth: Payer: Self-pay | Admitting: *Deleted

## 2023-07-21 ENCOUNTER — Encounter: Payer: Self-pay | Admitting: Orthopedic Surgery

## 2023-07-21 VITALS — BP 124/85 | HR 86 | Ht 61.0 in | Wt 143.0 lb

## 2023-07-21 DIAGNOSIS — G8929 Other chronic pain: Secondary | ICD-10-CM

## 2023-07-21 DIAGNOSIS — M25512 Pain in left shoulder: Secondary | ICD-10-CM

## 2023-07-21 NOTE — Telephone Encounter (Signed)
error 

## 2023-07-21 NOTE — Progress Notes (Signed)
New Patient Visit  Assessment: Connie May is a 67 y.o. female with the following: 1. Chronic left shoulder pain   Plan: YAKIMA LIDE [place] to the lateral aspect of the left shoulder couple months ago.  She continues to have pain, which is restricting her motion.  I do not think that she has developed a frozen shoulder.  Radiographs are negative for acute injury.  Urged her to continue working on her range of motion, with transition to strengthening.  She can try topical treatments like Voltaren gel, or lidocaine patch.  If she continues to struggle, I would recommend physical therapy.  She states her understanding.  If she has any further issues, she will contact the clinic.  Follow-up: Return if symptoms worsen or fail to improve.  Subjective:  Chief Complaint  Patient presents with   Arm Pain    L/ fell back on 05/05/23 hit her arm on the tv stand edge, it bruised then but bruise is gone, but pain is still there. The pain goes up into the shoulder at times.    History of Present Illness: Connie May is a 67 y.o. female who has been referred by Leone Payor, FNP for evaluation of left shoulder pain.  Over 2 months ago, she stumbled and fell at home.  She impacted the outside aspect of her left shoulder.  Since then, she continues to have pain.  The pain has remained relatively constant.  She has noticed that she has restricted range of motion.  She has avoided activities which are painful.  She takes some Tylenol.  She has tried IcyHot topical treatment.  Goody's powders have been effective.  No physical therapy.  No prior injuries to her left shoulder.   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   Depression    Headache     Hyperlipidemia    Nephrolithiasis    history of   Panic attacks     Past Surgical History:  Procedure Laterality Date   APPENDECTOMY     BIOPSY  12/17/2018   Procedure: BIOPSY;  Surgeon: Malissa Hippo, MD;  [facility];  Service: Endoscopy;;   BREAST LUMPECTOMY WITH RADIOACTIVE SEED AND SENTINEL LYMPH NODE BIOPSY Right 04/28/2022   Procedure: RIGHT BREAST LUMPECTOMY WITH RADIOACTIVE SEED AND SENTINEL LYMPH NODE BIOPSY;  Surgeon: Harriette Bouillon, MD;  Location: [facility];  Service: General;  Laterality: Right;  GEN & PEC BLOCK   calculus retrieval     x 2    COLONOSCOPY N/A 12/17/2018   Procedure: COLONOSCOPY;  Surgeon: Malissa Hippo, MD;  [facility];  Service: Endoscopy;  Laterality: N/A;   open RT knee surgery     POLYPECTOMY  12/17/2018   Procedure: POLYPECTOMY;  Surgeon: Malissa Hippo, MD;  [facility];  Service: Endoscopy;;    Family History  Problem Relation Age of Onset   Stroke Mother    Depression Father    Prostate cancer Father    Lung cancer Father    Bipolar disorder Sister        ]   Hepatitis Sister    Stroke Maternal Grandmother    Colon cancer Neg Hx  Social History   Tobacco Use   Smoking status: Never   Smokeless tobacco: Never  Vaping Use   Vaping status: Never Used  Substance Use Topics   Alcohol use: Not Currently   Drug use: Not Currently    Types: Marijuana    Allergies  Allergen Reactions   Hydrocodone-Acetaminophen Nausea And Vomiting and Rash    Current Meds  Medication Sig   clonazePAM (KLONOPIN) 1 MG tablet Take 1 mg by mouth at bedtime as needed for anxiety. For anxiety   ibuprofen (ADVIL) 800 MG tablet Take 1 tablet (800 mg total) by mouth every 8 (eight) hours as needed.   NURTEC 75 MG TBDP Take 1 tablet by mouth as needed.   omeprazole (PRILOSEC) 40 MG capsule Take 40 mg by mouth daily.   rosuvastatin (CRESTOR) 5 MG tablet Take 5 mg by mouth daily.   venlafaxine XR  (EFFEXOR-XR) 75 MG 24 hr capsule Take 75 mg by mouth daily.    Objective: BP 124/85   Pulse 86   Ht 5\' 1"  (1.549 m)   Wt 143 lb (64.9 kg)   BMI 27.02 kg/m   Physical Exam:  General: Alert and oriented. and No acute distress. Gait: Normal gait.  Left shoulder without deformity.  No atrophy.  No bruising.  Slightly restricted range of motion.  She has difficulty with internal rotation.  External rotation is similar to the contralateral side.  She does have some pain with resisted abduction, as well as supraspinatus testing.  Fingers warm well-perfused.  IMAGING: I personally ordered and reviewed the following images  X-rays of the left shoulder were obtained in clinic today.  No acute injuries noted.  Grade remains reduced.  Well-maintained joint space.  No evidence of proximal humeral migration.  No reactive bone in the proximal humerus.  No bony lesions.  Impression: Negative left shoulder x-ray   New Medications:  No orders of the defined types were placed in this encounter.     Oliver Barre, MD  07/21/2023 9:46 AM

## 2023-07-21 NOTE — Patient Instructions (Signed)

## 2023-07-26 ENCOUNTER — Other Ambulatory Visit: Payer: Self-pay | Admitting: Hematology and Oncology

## 2023-08-10 ENCOUNTER — Inpatient Hospital Stay: Payer: Medicare Other

## 2023-08-10 ENCOUNTER — Other Ambulatory Visit: Payer: Self-pay | Admitting: *Deleted

## 2023-08-10 ENCOUNTER — Inpatient Hospital Stay: Payer: Medicare Other | Attending: Hematology and Oncology

## 2023-08-10 VITALS — BP 109/73 | HR 72 | Temp 97.5°F | Resp 16 | Ht 61.0 in | Wt 143.0 lb

## 2023-08-10 DIAGNOSIS — C50411 Malignant neoplasm of upper-outer quadrant of right female breast: Secondary | ICD-10-CM | POA: Insufficient documentation

## 2023-08-10 DIAGNOSIS — Z170 Estrogen receptor positive status [ER+]: Secondary | ICD-10-CM | POA: Insufficient documentation

## 2023-08-10 DIAGNOSIS — M810 Age-related osteoporosis without current pathological fracture: Secondary | ICD-10-CM | POA: Insufficient documentation

## 2023-08-10 LAB — BASIC METABOLIC PANEL - CANCER CENTER ONLY
Anion gap: 6 (ref 5–15)
BUN: 9 mg/dL (ref 8–23)
CO2: 30 mmol/L (ref 22–32)
Calcium: 9.9 mg/dL (ref 8.9–10.3)
Chloride: 105 mmol/L (ref 98–111)
Creatinine: 0.81 mg/dL (ref 0.44–1.00)
GFR, Estimated: >60 mL/min (ref >60)
Glucose, Bld: 105 mg/dL — ABNORMAL HIGH (ref 70–99)
Potassium: 4.2 mmol/L (ref 3.5–5.1)
Sodium: 141 mmol/L (ref 135–145)

## 2023-08-10 MED ORDER — SODIUM CHLORIDE 0.9% FLUSH: 10.0000 mL | Freq: Two times a day (BID) | INTRAVENOUS | Status: DC

## 2023-08-10 MED ORDER — SODIUM CHLORIDE 0.9 % IV SOLN: Freq: Once | INTRAVENOUS | Status: AC

## 2023-08-10 MED ORDER — ZOLEDRONIC ACID 4 MG/100ML IV SOLN
4.0000 mg | Freq: Once | INTRAVENOUS | Status: AC
  Administered 2023-08-10: 4 mg via INTRAVENOUS
  Filled 2023-08-10: qty 100

## 2023-08-10 NOTE — Patient Instructions (Signed)

## 2023-08-10 NOTE — Progress Notes (Signed)
Pt. declines any dental issues or concerns and states she takes oral Calcium and Vitamin D as directed.

## 2023-08-25 ENCOUNTER — Other Ambulatory Visit: Payer: Self-pay | Admitting: Hematology and Oncology

## 2023-08-28 ENCOUNTER — Encounter: Payer: Self-pay | Admitting: Hematology and Oncology

## 2023-09-08 ENCOUNTER — Inpatient Hospital Stay: Payer: Medicare Other | Attending: Hematology and Oncology | Admitting: Hematology and Oncology

## 2023-09-08 ENCOUNTER — Other Ambulatory Visit: Payer: Self-pay | Admitting: Pharmacist

## 2023-09-08 VITALS — BP 114/60 | HR 75 | Temp 98.2°F | Resp 16 | Wt 142.6 lb

## 2023-09-08 DIAGNOSIS — Z170 Estrogen receptor positive status [ER+]: Secondary | ICD-10-CM | POA: Diagnosis not present

## 2023-09-08 DIAGNOSIS — Z79811 Long term (current) use of aromatase inhibitors: Secondary | ICD-10-CM | POA: Insufficient documentation

## 2023-09-08 DIAGNOSIS — C50411 Malignant neoplasm of upper-outer quadrant of right female breast: Secondary | ICD-10-CM | POA: Insufficient documentation

## 2023-09-08 DIAGNOSIS — M810 Age-related osteoporosis without current pathological fracture: Secondary | ICD-10-CM | POA: Diagnosis not present

## 2023-09-08 DIAGNOSIS — Z923 Personal history of irradiation: Secondary | ICD-10-CM | POA: Diagnosis not present

## 2023-09-08 NOTE — Progress Notes (Signed)
[facility] CONSULT NOTE  Patient Care Team: Benita Stabile, MD as PCP - General (Internal Medicine) Jena Gauss, Gerrit Friends, MD as Consulting Physician (Gastroenterology) Rachel Moulds, MD as Consulting Physician (Hematology and Oncology) Lonie Peak, MD as Attending Physician (Radiation Oncology) Harriette Bouillon, MD as Consulting Physician (General Surgery)  CHIEF COMPLAINTS/PURPOSE OF CONSULTATION:  Newly diagnosed breast cancer  HISTORY OF PRESENTING ILLNESS:  Connie May 67 y.o. female is here because of recent diagnosis of right breast cancer  I reviewed her records extensively and collaborated the history with the patient.  SUMMARY OF ONCOLOGIC HISTORY: Oncology History  Malignant neoplasm of upper-outer quadrant of right breast in female, estrogen receptor positive (HCC)  01/20/2022 Mammogram   Mammogram done on April 20 showed right breast focal asymmetry requiring further evaluation of a left breast asymmetry requiring further evaluation.  Diagnostic mammogram done showed persistent distortion in the upper outer right breast without definite sonographic correlate.  No abnormal appearing right axillary lymph nodes.  No persistent mammographic abnormality in the area of the left breast screening study finding.   02/24/2022 Pathology Results   Stereotactic biopsy from the right breast at 9 o'clock position showed invasive well-differentiated ductal adenocarcinoma grade 1 measuring 4 mm in greatest extent, focal intermediate grade DCIS.  Prognostic showed ER 100% positive strong staining PR 0% negative, Ki-67 of 10%, HER2 negative   03/25/2022 Cancer Staging   Staging form: Breast, AJCC 8th Edition - Clinical stage from 03/25/2022: Stage Unknown (cTX, cN0, cM0, G1, ER+, PR-, HER2-) - Signed by Rachel Moulds, MD on 03/25/2022 Nuclear grade: G1 Histologic grading system: 3 grade system   04/28/2022 Surgery   RIGHT BREAST, LUMPECTOMY: pT1c, pN0, Oncotype DX Breast  Recurrence Score is 17  Final pathology showed grade 2 invasive moderately differentiated adenocarcinoma with focal DCIS.  Tumor measured 1.6 x 0.7 x 0.5 cm, negative margins.  3 lymph nodes negative for carcinoma   04/28/2022 Cancer Staging   Staging form: Breast, AJCC 8th Edition - Pathologic stage from 04/28/2022: Stage IA (pT1c, pN0, cM0, G2, ER+, PR-, HER2-) - Signed by Loa Socks, NP on 11/28/2022 Stage prefix: Initial diagnosis Histologic grading system: 3 grade system   05/17/2022 Oncotype testing   17/5%   06/08/2022 - 07/06/2022 Radiation Therapy   06/08/2022 through 07/06/2022 Site Technique Total Dose (Gy) Dose per Fx (Gy) Completed Fx Beam Energies  Breast, Right: Breast_R 3D 40.05/40.05 2.67 15/15 6X  Breast, Right: Breast_R_Bst 3D 10/10 2 5/5 6X, 10X     09/2022 -  Anti-estrogen oral therapy   Anastrozole daily    Discussed the use of AI scribe software for clinical note transcription with the patient, who gave verbal consent to proceed.  History of Present Illness    The patient, with a history of osteoporosis and breast cancer, here for follow up.  The patient, with a history of osteoporosis and breast cancer, presents for a routine follow-up. She reports no side effects from her recent infusion and is tolerating daily Anastrozole well. She has not experienced any recent hot flashes and is managing well with Effexor. She has been walking regularly and using ankle weights for additional resistance. She has also been seeing her dentist every four months and is planning to get a partial denture in 2025. She gets zometa every 6 months for osteoporosis. She is due for a mammogram in May 2025. Rest of the pertinent 10 point ROS reviewed and neg.  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   Depression    Headache    Hyperlipidemia    Nephrolithiasis    history of   Panic attacks      SURGICAL HISTORY: Past Surgical History:  Procedure Laterality Date   APPENDECTOMY     BIOPSY  12/17/2018   Procedure: BIOPSY;  Surgeon: Malissa Hippo, MD;  [facility];  Service: Endoscopy;;   BREAST LUMPECTOMY WITH RADIOACTIVE SEED AND SENTINEL LYMPH NODE BIOPSY Right 04/28/2022   Procedure: RIGHT BREAST LUMPECTOMY WITH RADIOACTIVE SEED AND SENTINEL LYMPH NODE BIOPSY;  Surgeon: Harriette Bouillon, MD;  Location: [facility];  Service: General;  Laterality: Right;  GEN & PEC BLOCK   calculus retrieval     x 2    COLONOSCOPY N/A 12/17/2018   Procedure: COLONOSCOPY;  Surgeon: Malissa Hippo, MD;  [facility];  Service: Endoscopy;  Laterality: N/A;   open RT knee surgery     POLYPECTOMY  12/17/2018   Procedure: POLYPECTOMY;  Surgeon: Malissa Hippo, MD;  [facility];  Service: Endoscopy;;    SOCIAL HISTORY: Social History   Socioeconomic History   Marital status: Married    Spouse name: Not on file   Number of children: 2    Years of education: Not on file   Highest education level: Not on file  Occupational History    Employer: [employer]  Tobacco Use   Smoking status: Never   Smokeless tobacco: Never  Vaping Use   Vaping status: Never Used  Substance and Sexual Activity   Alcohol use: Not Currently   Drug use: Not Currently    Types: Marijuana   Sexual activity: Not Currently    Birth control/protection: Post-menopausal  Other Topics Concern   Not on file  Social History Narrative   No regular exercise.    Social Determinants of Health   Financial Resource Strain: Not on file  Food Insecurity: Not on file  Transportation Needs: Not on file  Physical Activity: Not on file  Stress: Not on file  Social Connections: Not on file  Intimate Partner Violence: Not on file    FAMILY HISTORY: Family History  Problem Relation Age of Onset   Stroke Mother    Depression Father    Prostate cancer Father    Lung  cancer Father    Bipolar disorder Sister        ]   Hepatitis Sister    Stroke Maternal Grandmother    Colon cancer Neg Hx     ALLERGIES:  is allergic to hydrocodone-acetaminophen.  MEDICATIONS:  Current Outpatient Medications  Medication Sig Dispense Refill   anastrozole (ARIMIDEX) 1 MG tablet Take 1 tablet by mouth once daily 90 tablet 0   aspirin-acetaminophen-caffeine (EXCEDRIN MIGRAINE) 250-250-65 MG tablet 1 tablet every 6 (six) hours as needed for headache or migraine (Takes BC powder).     clonazePAM (KLONOPIN) 1 MG tablet Take 1 mg by mouth at bedtime as needed for anxiety. For anxiety     ibuprofen (ADVIL) 800 MG tablet Take 1 tablet (800 mg total) by mouth every 8 (eight) hours as needed. 30 tablet 0   NURTEC 75 MG TBDP Take 1 tablet by mouth as needed.     omeprazole (PRILOSEC) 40 MG capsule Take 40 mg by mouth daily.     rosuvastatin (CRESTOR) 5 MG tablet Take 5 mg by mouth daily.  venlafaxine XR (EFFEXOR-XR) 75 MG 24 hr capsule Take 75 mg by mouth daily.     No current facility-administered medications for this visit.    REVIEW OF SYSTEMS:   Constitutional: Denies fevers, chills or abnormal night sweats Eyes: Denies blurriness of vision, double vision or watery eyes Ears, nose, mouth, throat, and face: Denies mucositis or sore throat Respiratory: Denies cough, dyspnea or wheezes Cardiovascular: Denies palpitation, chest discomfort or lower extremity swelling Gastrointestinal:  Denies nausea, heartburn or change in bowel habits Skin: Denies abnormal skin rashes Lymphatics: Denies new lymphadenopathy or easy bruising Neurological:Denies numbness, tingling or new weaknesses Behavioral/Psych: Mood is stable, no new changes  Breast: Denies any palpable lumps or discharge All other systems were reviewed with the patient and are negative.  PHYSICAL EXAMINATION: ECOG PERFORMANCE STATUS: 0 - Asymptomatic  Vitals:   09/08/23 1154  BP: 114/60  Pulse: 75  Resp: 16   Temp: 98.2 F (36.8 C)  SpO2: 100%    Filed Weights   09/08/23 1154  Weight: 142 lb 9.6 oz (64.7 kg)   General appearance: Alert, oriented and in no acute distress Bilateral breasts examined, no palpable masses or regional adenopathy.   LABORATORY DATA:  I have reviewed the data as listed Lab Results  Component Value Date   WBC 6.6 12/13/2011   HGB 14.4 12/13/2011   HCT 43.2 12/13/2011   MCV 89.1 12/13/2011   PLT 243 12/13/2011   Lab Results  Component Value Date   NA 141 08/10/2023   K 4.2 08/10/2023   CL 105 08/10/2023   CO2 30 08/10/2023    RADIOGRAPHIC STUDIES: I have personally reviewed the radiological reports and agreed with the findings in the report.  ASSESSMENT AND PLAN:  Malignant neoplasm of upper-outer quadrant of right breast in female, estrogen receptor positive (HCC) This is a very pleasant 67 year old postmenopausal female patient with no significant past medical history referred to breast oncology given new diagnosis of right breast invasive ductal carcinoma status post right breast lumpectomy with final pathology showing grade 2 invasive moderately differentiated adenocarcinoma with focal DCIS, 3 lymph nodes negative for carcinoma.  Oncotype DX score resulted at 17, distant risk of recurrence at 9 years is about 5% and no benefit of chemo in this age group.  She  completed adjuvant radiation, now on anastrozole.  Assessment and Plan  Breast Cancer Patient is tolerating Anastrozole well with no reported side effects. Infusion therapy for osteoporosis is also well tolerated. -Continue Anastrozole daily. -Next Zometa infusion due in April 2025.  Osteoporosis Patient is on Zometa infusion therapy, last administered in November 2024. -Next Zometa infusion due in April 2025. -Reminded patient to inform dentist about Zometa therapy before any dental procedures.  General Health Maintenance -Scheduled mammogram for May 2025. -Follow-up in six months.         Total time spent: 30 minutes involving history, review of records, physical exam, counseling and coordination of care All questions were answered. The patient knows to call the clinic with any problems, questions or concerns.    Rachel Moulds, MD 09/08/23

## 2023-09-08 NOTE — Assessment & Plan Note (Addendum)
This is a very pleasant 67 year old postmenopausal female patient with no significant past medical history referred to breast oncology given new diagnosis of right breast invasive ductal carcinoma status post right breast lumpectomy with final pathology showing grade 2 invasive moderately differentiated adenocarcinoma with focal DCIS, 3 lymph nodes negative for carcinoma.  Oncotype DX score resulted at 17, distant risk of recurrence at 9 years is about 5% and no benefit of chemo in this age group.  She  completed adjuvant radiation, now on anastrozole.  Assessment and Plan  Breast Cancer Patient is tolerating Anastrozole well with no reported side effects. Infusion therapy for osteoporosis is also well tolerated. -Continue Anastrozole daily. -Next Zometa infusion due in April 2025.  Osteoporosis Patient is on Zometa infusion therapy, last administered in November 2024. -Next Zometa infusion due in April 2025. -Reminded patient to inform dentist about Zometa therapy before any dental procedures.  General Health Maintenance -Scheduled mammogram for May 2025. -Follow-up in six months.

## 2023-09-14 DIAGNOSIS — E785 Hyperlipidemia, unspecified: Secondary | ICD-10-CM | POA: Diagnosis not present

## 2023-10-03 DIAGNOSIS — R7303 Prediabetes: Secondary | ICD-10-CM | POA: Diagnosis not present

## 2023-10-03 DIAGNOSIS — G43909 Migraine, unspecified, not intractable, without status migrainosus: Secondary | ICD-10-CM | POA: Diagnosis not present

## 2023-10-03 DIAGNOSIS — E785 Hyperlipidemia, unspecified: Secondary | ICD-10-CM | POA: Diagnosis not present

## 2023-10-03 DIAGNOSIS — C50911 Malignant neoplasm of unspecified site of right female breast: Secondary | ICD-10-CM | POA: Diagnosis not present

## 2023-10-03 DIAGNOSIS — M5030 Other cervical disc degeneration, unspecified cervical region: Secondary | ICD-10-CM | POA: Diagnosis not present

## 2023-10-03 DIAGNOSIS — K449 Diaphragmatic hernia without obstruction or gangrene: Secondary | ICD-10-CM | POA: Diagnosis not present

## 2023-10-03 DIAGNOSIS — M810 Age-related osteoporosis without current pathological fracture: Secondary | ICD-10-CM | POA: Diagnosis not present

## 2023-10-03 DIAGNOSIS — G4759 Other parasomnia: Secondary | ICD-10-CM | POA: Diagnosis not present

## 2023-11-02 ENCOUNTER — Inpatient Hospital Stay: Payer: Medicare Other

## 2023-11-02 ENCOUNTER — Inpatient Hospital Stay: Payer: Medicare Other | Admitting: Hematology and Oncology

## 2023-11-17 ENCOUNTER — Encounter (INDEPENDENT_AMBULATORY_CARE_PROVIDER_SITE_OTHER): Payer: Self-pay | Admitting: *Deleted

## 2023-11-24 ENCOUNTER — Other Ambulatory Visit: Payer: Self-pay | Admitting: Hematology and Oncology

## 2024-01-24 NOTE — Progress Notes (Signed)
 [facility] CONSULT NOTE  Patient Care Team: Shona Norleen PEDLAR, MD as PCP - General (Internal Medicine) Shaaron, Lamar HERO, MD as Consulting Physician (Gastroenterology) Loretha Ash, MD as Consulting Physician (Hematology and Oncology) Izell Domino, MD as Attending Physician (Radiation Oncology) Vanderbilt Ned, MD as Consulting Physician (General Surgery)  CHIEF COMPLAINTS/PURPOSE OF CONSULTATION:  Newly diagnosed breast cancer  HISTORY OF PRESENTING ILLNESS:  Connie May 68 y.o. female is here because of recent diagnosis of right breast cancer  I reviewed her records extensively and collaborated the history with the patient.  SUMMARY OF ONCOLOGIC HISTORY: Oncology History  Malignant neoplasm of upper-outer quadrant of right breast in female, estrogen receptor positive (HCC)  01/20/2022 Mammogram   Mammogram done on April 20 showed right breast focal asymmetry requiring further evaluation of a left breast asymmetry requiring further evaluation.  Diagnostic mammogram done showed persistent distortion in the upper outer right breast without definite sonographic correlate.  No abnormal appearing right axillary lymph nodes.  No persistent mammographic abnormality in the area of the left breast screening study finding.   02/24/2022 Pathology Results   Stereotactic biopsy from the right breast at 9 o'clock position showed invasive well-differentiated ductal adenocarcinoma grade 1 measuring 4 mm in greatest extent, focal intermediate grade DCIS.  Prognostic showed ER 100% positive strong staining PR 0% negative, Ki-67 of 10%, HER2 negative   03/25/2022 Cancer Staging   Staging form: Breast, AJCC 8th Edition - Clinical stage from 03/25/2022: Stage Unknown (cTX, cN0, cM0, G1, ER+, PR-, HER2-) - Signed by Loretha Ash, MD on 03/25/2022 Nuclear grade: G1 Histologic grading system: 3 grade system   04/28/2022 Surgery   RIGHT BREAST, LUMPECTOMY: pT1c, pN0, Oncotype DX Breast  Recurrence Score is 17  Final pathology showed grade 2 invasive moderately differentiated adenocarcinoma with focal DCIS.  Tumor measured 1.6 x 0.7 x 0.5 cm, negative margins.  3 lymph nodes negative for carcinoma   04/28/2022 Cancer Staging   Staging form: Breast, AJCC 8th Edition - Pathologic stage from 04/28/2022: Stage IA (pT1c, pN0, cM0, G2, ER+, PR-, HER2-) - Signed by Crawford Morna Pickle, NP on 11/28/2022 Stage prefix: Initial diagnosis Histologic grading system: 3 grade system   05/17/2022 Oncotype testing   17/5%   06/08/2022 - 07/06/2022 Radiation Therapy   06/08/2022 through 07/06/2022 Site Technique Total Dose (Gy) Dose per Fx (Gy) Completed Fx Beam Energies  Breast, Right: Breast_R 3D 40.05/40.05 2.67 15/15 6X  Breast, Right: Breast_R_Bst 3D 10/10 2 5/5 6X, 10X     09/2022 -  Anti-estrogen oral therapy   Anastrozole  daily    History of Present Illness    The patient, with a history of osteoporosis and breast cancer, here for follow up.  Discussed the use of AI scribe software for clinical note transcription with the patient, who gave verbal consent to proceed.  History of Present Illness Connie May is a 68 year old female who presents for follow up of her BC and osteoporosis. She is scheduled for a bone strengthening infusion today as part of her osteoporosis management. These infusions are administered every six months. There was a delay in scheduling this infusion due to communication issues with the clinic, which she discovered by checking her MyChart account.  She experiences persistent numbness in her arm following a previous surgery. The sensation is described as similar to when a limb 'goes to sleep' and itches. This numbness has been present since the surgery and has shown some improvement but remains bothersome.  She continues to experience hot flashes despite taking Effexor. The medication helps, but the hot flashes persist, causing discomfort,  particularly at night, affecting her sleep and her husband's comfort due to the need for fans and temperature adjustments.  No new dental problems are reported, and she has been seeing her dentist every three to four months.   Rest of the pertinent 10 point ROS reviewed and neg.  MEDICAL HISTORY:  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   Depression    Headache    Hyperlipidemia    Nephrolithiasis    history of   Panic attacks     SURGICAL HISTORY: Past Surgical History:  Procedure Laterality Date   APPENDECTOMY     BIOPSY  12/17/2018   Procedure: BIOPSY;  Surgeon: Golda Claudis PENNER, MD;  Location: [facility];  Service: Endoscopy;;   BREAST LUMPECTOMY WITH RADIOACTIVE SEED AND SENTINEL LYMPH NODE BIOPSY Right 04/28/2022   Procedure: RIGHT BREAST LUMPECTOMY WITH RADIOACTIVE SEED AND SENTINEL LYMPH NODE BIOPSY;  Surgeon: Vanderbilt Ned, MD;  Location: [facility];  Service: General;  Laterality: Right;  GEN & PEC BLOCK   calculus retrieval     x 2    COLONOSCOPY N/A 12/17/2018   Procedure: COLONOSCOPY;  Surgeon: Golda Claudis PENNER, MD;  Location: [facility];  Service: Endoscopy;  Laterality: N/A;   open RT knee surgery     POLYPECTOMY  12/17/2018   Procedure: POLYPECTOMY;  Surgeon: Golda Claudis PENNER, MD;  Location: [facility];  Service: Endoscopy;;    SOCIAL HISTORY: Social History   Socioeconomic History   Marital status: Married    Spouse name: Not on file   Number of children: 2    Years of education: Not on file   Highest education level: Not on file  Occupational History    Employer: [employer]  Tobacco Use   Smoking status: Never   Smokeless tobacco: Never  Vaping Use   Vaping status: Never Used  Substance and Sexual Activity   Alcohol use: Not Currently   Drug use: Not Currently    Types: Marijuana   Sexual activity: Not Currently    Birth control/protection: Post-menopausal   Other Topics Concern   Not on file  Social History Narrative   No regular exercise.    Social Drivers of Corporate Investment Banker Strain: Not on file  Food Insecurity: Not on file  Transportation Needs: Not on file  Physical Activity: Not on file  Stress: Not on file  Social Connections: Not on file  Intimate Partner Violence: Not on file    FAMILY HISTORY: Family History  Problem Relation Age of Onset   Stroke Mother    Depression Father    Prostate cancer Father    Lung cancer Father    Bipolar disorder Sister        ]   Hepatitis Sister    Stroke Maternal Grandmother    Colon cancer Neg Hx     ALLERGIES:  is allergic to hydrocodone -acetaminophen .  MEDICATIONS:  Current Outpatient Medications  Medication Sig Dispense Refill   anastrozole  (ARIMIDEX ) 1 MG tablet Take 1 tablet by mouth once daily 90 tablet 0   aspirin -acetaminophen -caffeine (EXCEDRIN MIGRAINE) 250-250-65 MG tablet 1 tablet every 6 (six) hours as needed for headache or migraine (Takes BC powder).     clonazePAM (KLONOPIN) 1 MG tablet Take 1 mg by  mouth at bedtime as needed for anxiety. For anxiety     ibuprofen  (ADVIL ) 800 MG tablet Take 1 tablet (800 mg total) by mouth every 8 (eight) hours as needed. 30 tablet 0   NURTEC 75 MG TBDP Take 1 tablet by mouth as needed.     omeprazole (PRILOSEC) 40 MG capsule Take 40 mg by mouth daily.     rosuvastatin (CRESTOR) 5 MG tablet Take 5 mg by mouth daily.     venlafaxine XR (EFFEXOR-XR) 75 MG 24 hr capsule Take 75 mg by mouth daily.     No current facility-administered medications for this visit.   Facility-Administered Medications Ordered in Other Visits  Medication Dose Route Frequency Provider Last Rate Last Admin   0.9 %  sodium chloride  infusion   Intravenous Continuous Wynee Matarazzo, MD 40 mL/hr at 01/25/24 1238 New Bag at 01/25/24 1238   sodium chloride  flush (NS) 0.9 % injection 10 mL  10 mL Intravenous Q12H Yasin Ducat, MD        REVIEW OF  SYSTEMS:   Constitutional: Denies fevers, chills or abnormal night sweats Eyes: Denies blurriness of vision, double vision or watery eyes Ears, nose, mouth, throat, and face: Denies mucositis or sore throat Respiratory: Denies cough, dyspnea or wheezes Cardiovascular: Denies palpitation, chest discomfort or lower extremity swelling Gastrointestinal:  Denies nausea, heartburn or change in bowel habits Skin: Denies abnormal skin rashes Lymphatics: Denies new lymphadenopathy or easy bruising Neurological:Denies numbness, tingling or new weaknesses Behavioral/Psych: Mood is stable, no new changes  Breast: Denies any palpable lumps or discharge All other systems were reviewed with the patient and are negative.  PHYSICAL EXAMINATION: ECOG PERFORMANCE STATUS: 0 - Asymptomatic  Vitals:   01/25/24 1117  BP: 116/66  Pulse: 77  Resp: 16  Temp: 98.2 F (36.8 C)  SpO2: 100%     Filed Weights   01/25/24 1117  Weight: 141 lb (64 kg)    General appearance: Alert, oriented and in no acute distress Bilateral breasts examined, no palpable masses or regional adenopathy.   LABORATORY DATA:  I have reviewed the data as listed Lab Results  Component Value Date   WBC 5.6 01/25/2024   HGB 14.3 01/25/2024   HCT 43.9 01/25/2024   MCV 88.5 01/25/2024   PLT 213 01/25/2024   Lab Results  Component Value Date   NA 140 01/25/2024   K 4.2 01/25/2024   CL 107 01/25/2024   CO2 28 01/25/2024    RADIOGRAPHIC STUDIES: I have personally reviewed the radiological reports and agreed with the findings in the report.  ASSESSMENT AND PLAN:  Malignant neoplasm of upper-outer quadrant of right breast in female, estrogen receptor positive (HCC) This is a very pleasant 68 year old postmenopausal female patient with no significant past medical history referred to breast oncology given new diagnosis of right breast invasive ductal carcinoma status post right breast lumpectomy with final pathology showing  grade 2 invasive moderately differentiated adenocarcinoma with focal DCIS, 3 lymph nodes negative for carcinoma.  Oncotype DX score resulted at 17, distant risk of recurrence at 9 years is about 5% and no benefit of chemo in this age group.  She  completed adjuvant radiation, now on anastrozole .  Assessment and Plan Assessment & Plan Breast cancer on anastrozole  Tolerating it reasonably well except for hot flashes, bone pains.  Numbness in arm post-surgery Persistent numbness post-surgery suggests possible nerve damage. Some improvement noted.  Osteoporosis Osteoporosis managed with biannual bone-strengthening infusions. Discussed scheduling improvements. - Schedule bone-strengthening infusion today  at checkout.  Hot flashes Hot flashes partially managed with Effexor, which reduces severity but symptoms persist.  Labs, zometa  and FU in 6 months.      Total time spent: 30 minutes involving history, review of records, physical exam, counseling and coordination of care All questions were answered. The patient knows to call the clinic with any problems, questions or concerns.    Amber Stalls, MD 01/25/24

## 2024-01-25 ENCOUNTER — Inpatient Hospital Stay: Payer: Medicare Other | Admitting: Hematology and Oncology

## 2024-01-25 ENCOUNTER — Inpatient Hospital Stay: Payer: Medicare Other | Attending: Hematology and Oncology

## 2024-01-25 ENCOUNTER — Inpatient Hospital Stay: Payer: Medicare Other

## 2024-01-25 ENCOUNTER — Other Ambulatory Visit: Payer: Self-pay

## 2024-01-25 VITALS — BP 116/66 | HR 77 | Temp 98.2°F | Resp 16 | Wt 141.0 lb

## 2024-01-25 VITALS — BP 130/68 | HR 70 | Resp 16

## 2024-01-25 DIAGNOSIS — M810 Age-related osteoporosis without current pathological fracture: Secondary | ICD-10-CM | POA: Insufficient documentation

## 2024-01-25 DIAGNOSIS — C50412 Malignant neoplasm of upper-outer quadrant of left female breast: Secondary | ICD-10-CM | POA: Diagnosis not present

## 2024-01-25 DIAGNOSIS — Z170 Estrogen receptor positive status [ER+]: Secondary | ICD-10-CM | POA: Insufficient documentation

## 2024-01-25 DIAGNOSIS — C50411 Malignant neoplasm of upper-outer quadrant of right female breast: Secondary | ICD-10-CM | POA: Diagnosis not present

## 2024-01-25 DIAGNOSIS — Z7983 Long term (current) use of bisphosphonates: Secondary | ICD-10-CM | POA: Diagnosis not present

## 2024-01-25 LAB — CMP (CANCER CENTER ONLY)
ALT: 14 U/L (ref 0–44)
AST: 25 U/L (ref 15–41)
Albumin: 4.6 g/dL (ref 3.5–5.0)
Alkaline Phosphatase: 52 U/L (ref 38–126)
Anion gap: 5 (ref 5–15)
BUN: 9 mg/dL (ref 8–23)
CO2: 28 mmol/L (ref 22–32)
Calcium: 9.7 mg/dL (ref 8.9–10.3)
Chloride: 107 mmol/L (ref 98–111)
Creatinine: 0.72 mg/dL (ref 0.44–1.00)
GFR, Estimated: >60 mL/min (ref >60)
Glucose, Bld: 110 mg/dL — ABNORMAL HIGH (ref 70–99)
Potassium: 4.2 mmol/L (ref 3.5–5.1)
Sodium: 140 mmol/L (ref 135–145)
Total Bilirubin: 0.5 mg/dL (ref 0.0–1.2)
Total Protein: 7.3 g/dL (ref 6.5–8.1)

## 2024-01-25 LAB — CBC WITH DIFFERENTIAL (CANCER CENTER ONLY)
Abs Immature Granulocytes: 0.01 10*3/uL (ref 0.00–0.07)
Basophils Absolute: 0.1 10*3/uL (ref 0.0–0.1)
Basophils Relative: 1 %
Eosinophils Absolute: 0.1 10*3/uL (ref 0.0–0.5)
Eosinophils Relative: 2 %
HCT: 43.9 % (ref 36.0–46.0)
Hemoglobin: 14.3 g/dL (ref 12.0–15.0)
Immature Granulocytes: 0 %
Lymphocytes Relative: 35 %
Lymphs Abs: 2 10*3/uL (ref 0.7–4.0)
MCH: 28.8 pg (ref 26.0–34.0)
MCHC: 32.6 g/dL (ref 30.0–36.0)
MCV: 88.5 fL (ref 80.0–100.0)
Monocytes Absolute: 0.5 10*3/uL (ref 0.1–1.0)
Monocytes Relative: 9 %
Neutro Abs: 2.9 10*3/uL (ref 1.7–7.7)
Neutrophils Relative %: 53 %
Platelet Count: 213 10*3/uL (ref 150–400)
RBC: 4.96 MIL/uL (ref 3.87–5.11)
RDW: 13.1 % (ref 11.5–15.5)
WBC Count: 5.6 10*3/uL (ref 4.0–10.5)
nRBC: 0 % (ref 0.0–0.2)

## 2024-01-25 MED ORDER — ZOLEDRONIC ACID 4 MG/100ML IV SOLN
4.0000 mg | Freq: Once | INTRAVENOUS | Status: AC
Start: 2024-01-25 — End: 2024-01-25
  Administered 2024-01-25: 4 mg via INTRAVENOUS
  Filled 2024-01-25: qty 100

## 2024-01-25 MED ORDER — SODIUM CHLORIDE 0.9 % IV SOLN: INTRAVENOUS | Status: DC

## 2024-01-25 MED ORDER — SODIUM CHLORIDE 0.9% FLUSH: 10.0000 mL | Freq: Two times a day (BID) | INTRAVENOUS | Status: DC

## 2024-01-25 NOTE — Patient Instructions (Signed)

## 2024-01-25 NOTE — Assessment & Plan Note (Signed)
 This is a very pleasant 68 year old postmenopausal female patient with no significant past medical history referred to breast oncology given new diagnosis of right breast invasive ductal carcinoma status post right breast lumpectomy with final pathology showing grade 2 invasive moderately differentiated adenocarcinoma with focal DCIS, 3 lymph nodes negative for carcinoma.  Oncotype DX score resulted at 17, distant risk of recurrence at 9 years is about 5% and no benefit of chemo in this age group.  She  completed adjuvant radiation, now on anastrozole .  Assessment and Plan Assessment & Plan Breast cancer on anastrozole  Tolerating it reasonably well except for hot flashes, bone pains.  Numbness in arm post-surgery Persistent numbness post-surgery suggests possible nerve damage. Some improvement noted.  Osteoporosis Osteoporosis managed with biannual bone-strengthening infusions. Discussed scheduling improvements. - Schedule bone-strengthening infusion today at checkout.  Hot flashes Hot flashes partially managed with Effexor, which reduces severity but symptoms persist.  Labs, zometa  and FU in 6 months.

## 2024-02-21 IMAGING — MG MM DIGITAL SCREENING BILAT W/ TOMO AND CAD
8 series · 9 of 24 positions shown · non-contrast
Comparison: Previous exam(s).

CLINICAL DATA: Screening.

EXAM:
DIGITAL SCREENING BILATERAL MAMMOGRAM WITH TOMOSYNTHESIS AND CAD
TECHNIQUE: Bilateral screening digital craniocaudal and mediolateral oblique
mammograms were obtained. Bilateral screening digital breast
tomosynthesis was performed. The images were evaluated with
computer-aided detection.

[R MLO synth-2D]
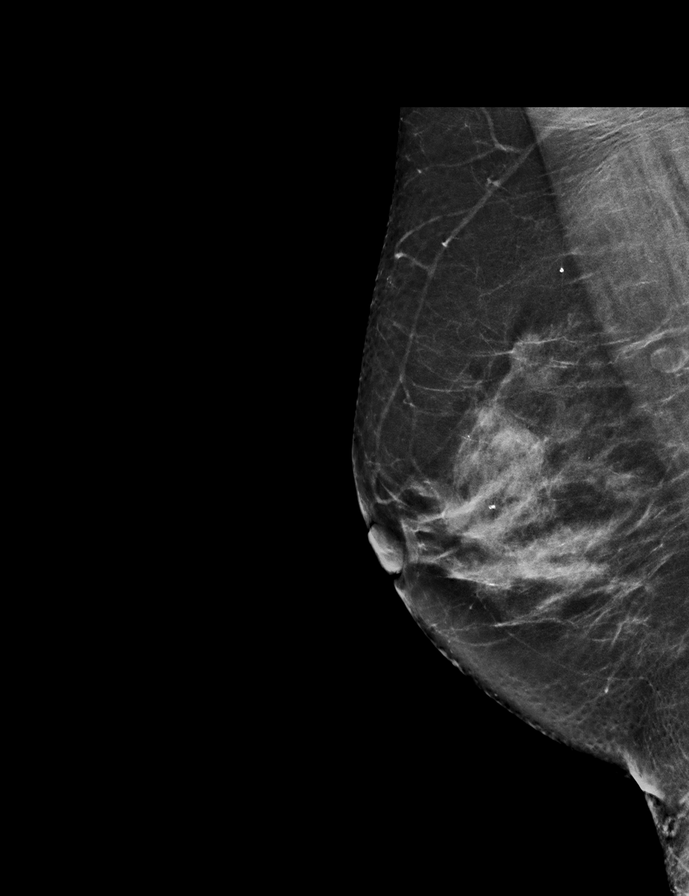

[L CC synth-2D]
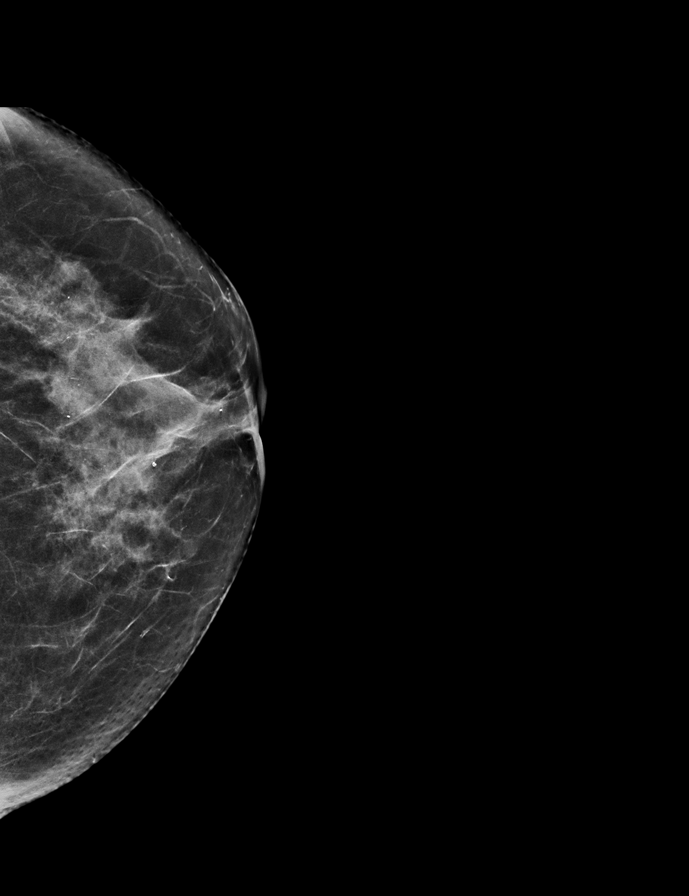

[R CC synth-2D]
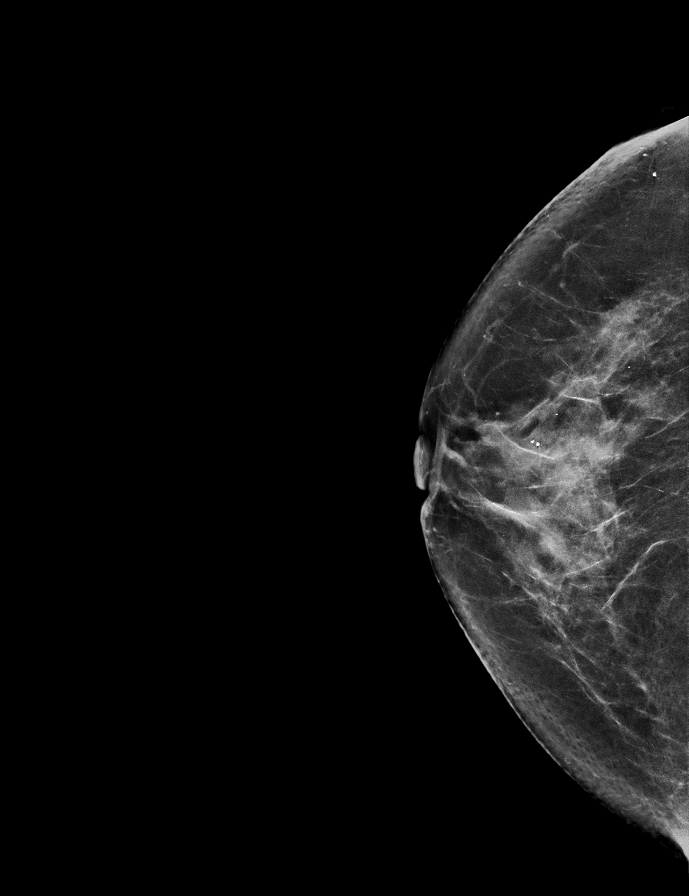

[L MLO synth-2D]
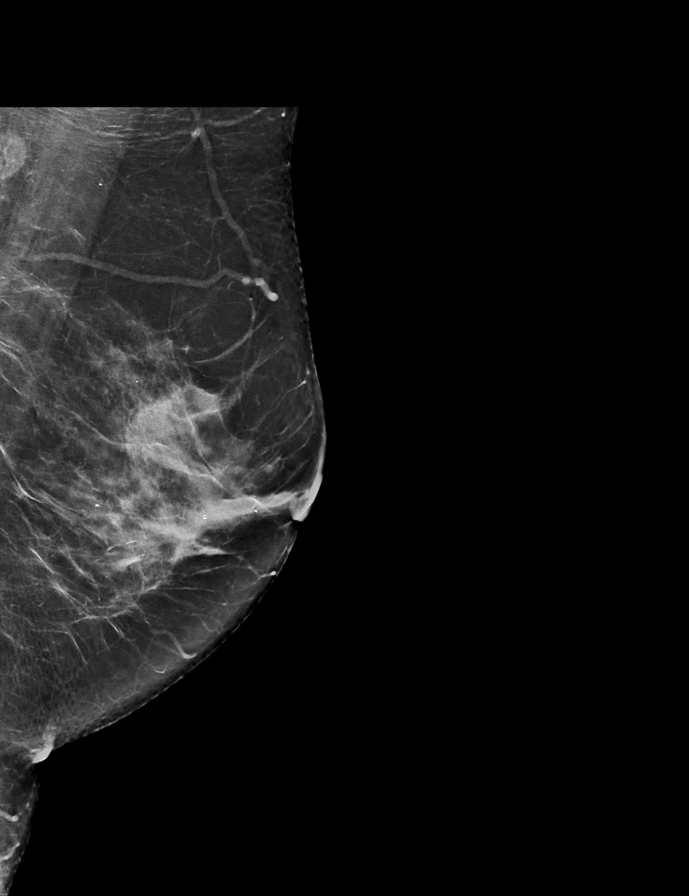

[R CC tomo · 2 of 68 frames shown]
[frame 22/68]
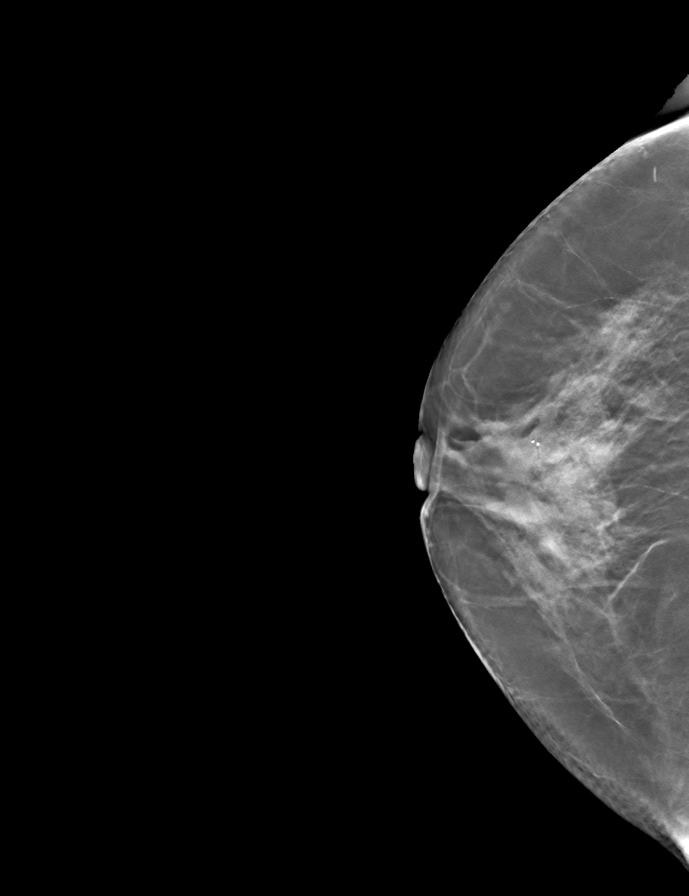
[frame 35/68]
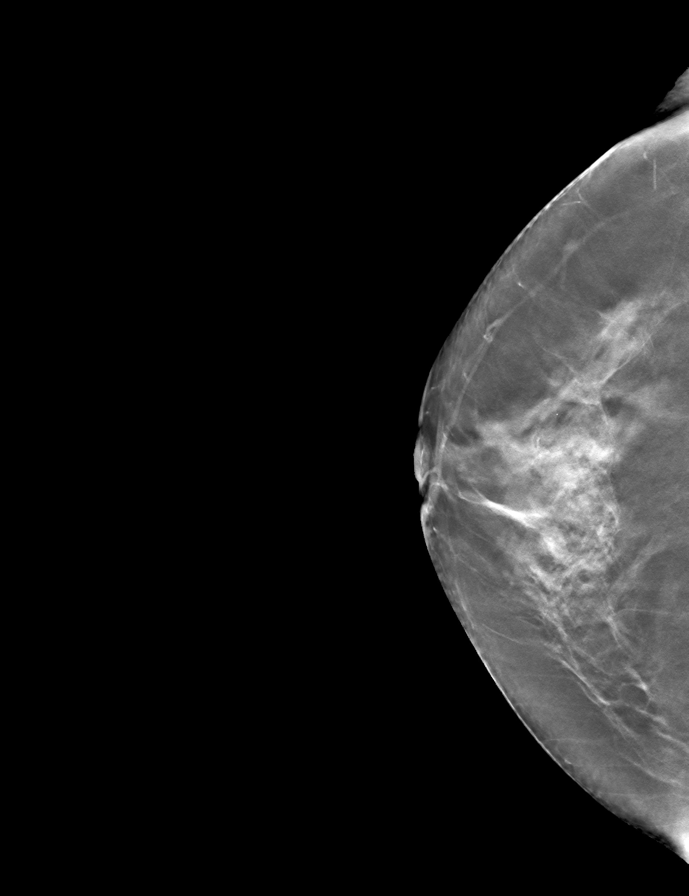

[L MLO tomo · tomo slice 35/69.0]
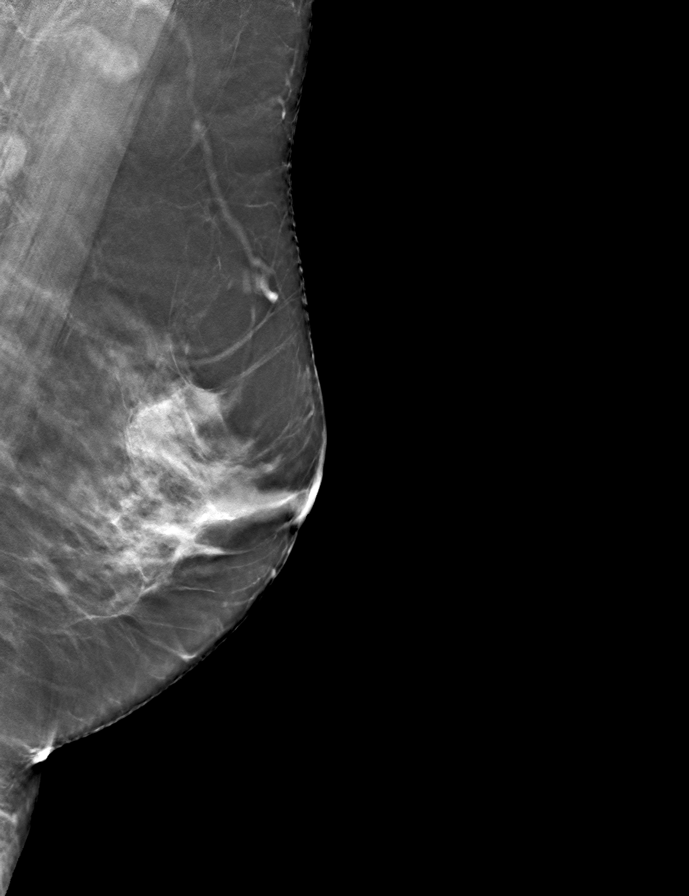

[R MLO tomo · tomo slice 34/67.0]
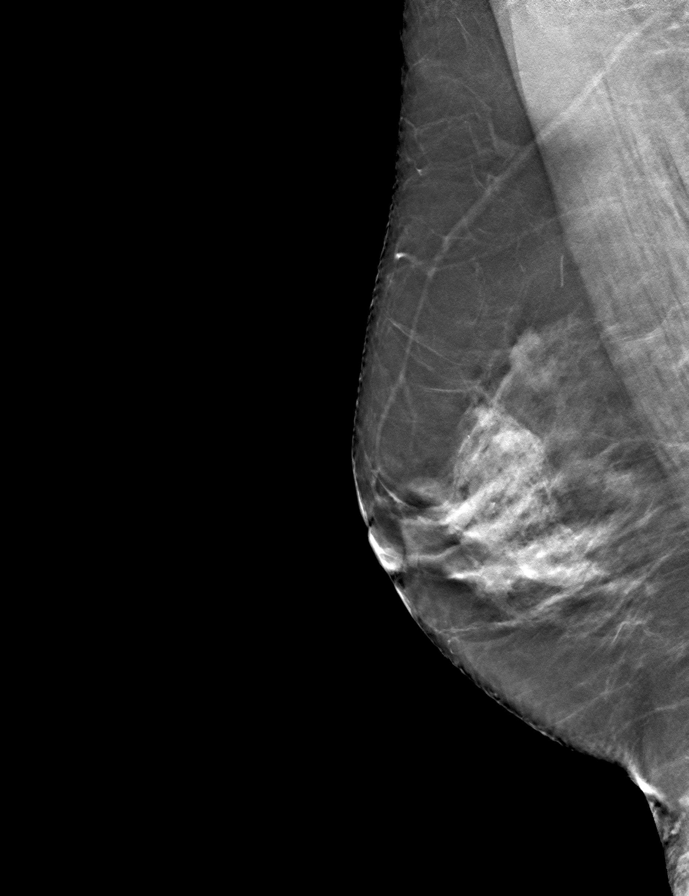

[L CC tomo · tomo slice 32/63.0]
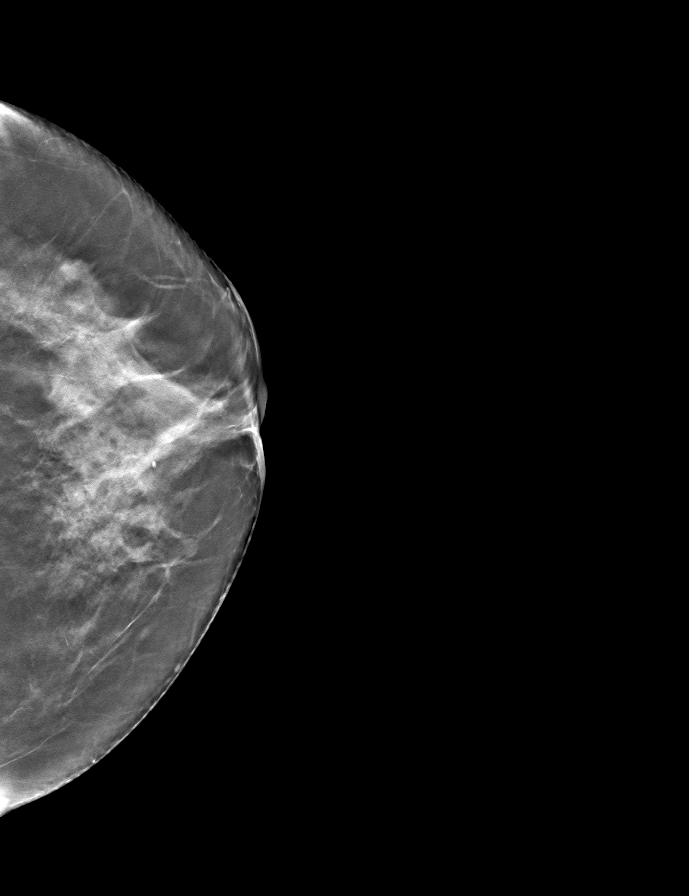

[9 of 24 positions shown; findings below may reference images not displayed]

ACR Breast Density Category c: The breast tissue is heterogeneously
dense, which may obscure small masses.
FINDINGS: In the right breast focal asymmetry requires further evaluation.

In the left breast asymmetry requires further evaluation.
IMPRESSION: Further evaluation is suggested for possible focal asymmetry in the
right breast.

Further evaluation is suggested for possible asymmetry in the left
breast.

RECOMMENDATION:
Diagnostic mammogram and possibly ultrasound of both breasts.
(Code:[id number])

The patient will be contacted regarding the findings, and additional
imaging will be scheduled.

BI-RADS CATEGORY  0: Incomplete. Need additional imaging evaluation
and/or prior mammograms for comparison.

## 2024-02-23 ENCOUNTER — Other Ambulatory Visit: Payer: Self-pay | Admitting: Medical Genetics

## 2024-02-27 ENCOUNTER — Ambulatory Visit
Admission: RE | Admit: 2024-02-27 | Discharge: 2024-02-27 | Disposition: A | Discharge status: Home / Self Care | Source: Ambulatory Visit | Attending: Hematology and Oncology

## 2024-02-27 DIAGNOSIS — Z08 Encounter for follow-up examination after completed treatment for malignant neoplasm: Secondary | ICD-10-CM | POA: Diagnosis not present

## 2024-02-27 DIAGNOSIS — Z170 Estrogen receptor positive status [ER+]: Secondary | ICD-10-CM

## 2024-02-27 DIAGNOSIS — Z853 Personal history of malignant neoplasm of breast: Secondary | ICD-10-CM | POA: Diagnosis not present

## 2024-03-01 ENCOUNTER — Other Ambulatory Visit: Payer: Self-pay | Admitting: Hematology and Oncology

## 2024-03-28 DIAGNOSIS — Z0000 Encounter for general adult medical examination without abnormal findings: Secondary | ICD-10-CM | POA: Diagnosis not present

## 2024-03-28 DIAGNOSIS — M810 Age-related osteoporosis without current pathological fracture: Secondary | ICD-10-CM | POA: Diagnosis not present

## 2024-04-03 ENCOUNTER — Other Ambulatory Visit (HOSPITAL_COMMUNITY): Payer: Self-pay | Admitting: Nurse Practitioner

## 2024-04-03 DIAGNOSIS — G43909 Migraine, unspecified, not intractable, without status migrainosus: Secondary | ICD-10-CM | POA: Diagnosis not present

## 2024-04-03 DIAGNOSIS — G4759 Other parasomnia: Secondary | ICD-10-CM | POA: Diagnosis not present

## 2024-04-03 DIAGNOSIS — C50411 Malignant neoplasm of upper-outer quadrant of right female breast: Secondary | ICD-10-CM | POA: Diagnosis not present

## 2024-04-03 DIAGNOSIS — E785 Hyperlipidemia, unspecified: Secondary | ICD-10-CM | POA: Diagnosis not present

## 2024-04-03 DIAGNOSIS — K449 Diaphragmatic hernia without obstruction or gangrene: Secondary | ICD-10-CM | POA: Diagnosis not present

## 2024-04-03 DIAGNOSIS — R7303 Prediabetes: Secondary | ICD-10-CM | POA: Diagnosis not present

## 2024-04-03 DIAGNOSIS — R413 Other amnesia: Secondary | ICD-10-CM | POA: Insufficient documentation

## 2024-04-03 DIAGNOSIS — Z0001 Encounter for general adult medical examination with abnormal findings: Secondary | ICD-10-CM | POA: Diagnosis not present

## 2024-04-03 DIAGNOSIS — M5030 Other cervical disc degeneration, unspecified cervical region: Secondary | ICD-10-CM | POA: Diagnosis not present

## 2024-04-03 DIAGNOSIS — M810 Age-related osteoporosis without current pathological fracture: Secondary | ICD-10-CM | POA: Diagnosis not present

## 2024-04-09 ENCOUNTER — Ambulatory Visit (HOSPITAL_COMMUNITY)
Admission: RE | Admit: 2024-04-09 | Discharge: 2024-04-09 | Disposition: A | Discharge status: Home / Self Care | Source: Ambulatory Visit | Attending: Nurse Practitioner | Admitting: Nurse Practitioner

## 2024-04-09 DIAGNOSIS — R413 Other amnesia: Secondary | ICD-10-CM | POA: Insufficient documentation

## 2024-04-11 ENCOUNTER — Telehealth: Payer: Self-pay | Admitting: Hematology and Oncology

## 2024-04-11 NOTE — Telephone Encounter (Signed)
 Called to reschedule patient appointment to due provider pal  request. I talked  to patient and they are aware of the changes that was made to the upcoming appointment

## 2024-05-23 ENCOUNTER — Other Ambulatory Visit: Payer: Self-pay | Admitting: Hematology and Oncology

## 2024-07-10 DIAGNOSIS — E785 Hyperlipidemia, unspecified: Secondary | ICD-10-CM | POA: Diagnosis not present

## 2024-07-10 DIAGNOSIS — R7303 Prediabetes: Secondary | ICD-10-CM | POA: Diagnosis not present

## 2024-07-16 DIAGNOSIS — K449 Diaphragmatic hernia without obstruction or gangrene: Secondary | ICD-10-CM | POA: Diagnosis not present

## 2024-07-16 DIAGNOSIS — F4381 Prolonged grief disorder: Secondary | ICD-10-CM | POA: Insufficient documentation

## 2024-07-16 DIAGNOSIS — M5030 Other cervical disc degeneration, unspecified cervical region: Secondary | ICD-10-CM | POA: Diagnosis not present

## 2024-07-16 DIAGNOSIS — R7303 Prediabetes: Secondary | ICD-10-CM | POA: Diagnosis not present

## 2024-07-16 DIAGNOSIS — R413 Other amnesia: Secondary | ICD-10-CM | POA: Diagnosis not present

## 2024-07-16 DIAGNOSIS — Z0000 Encounter for general adult medical examination without abnormal findings: Secondary | ICD-10-CM | POA: Diagnosis not present

## 2024-07-16 DIAGNOSIS — C50411 Malignant neoplasm of upper-outer quadrant of right female breast: Secondary | ICD-10-CM | POA: Diagnosis not present

## 2024-07-16 DIAGNOSIS — Z0001 Encounter for general adult medical examination with abnormal findings: Secondary | ICD-10-CM | POA: Diagnosis not present

## 2024-07-16 DIAGNOSIS — Z23 Encounter for immunization: Secondary | ICD-10-CM | POA: Diagnosis not present

## 2024-07-16 DIAGNOSIS — E785 Hyperlipidemia, unspecified: Secondary | ICD-10-CM | POA: Diagnosis not present

## 2024-07-16 DIAGNOSIS — G43909 Migraine, unspecified, not intractable, without status migrainosus: Secondary | ICD-10-CM | POA: Diagnosis not present

## 2024-07-23 ENCOUNTER — Encounter (INDEPENDENT_AMBULATORY_CARE_PROVIDER_SITE_OTHER): Payer: Self-pay | Admitting: *Deleted

## 2024-07-26 ENCOUNTER — Ambulatory Visit

## 2024-07-26 ENCOUNTER — Ambulatory Visit: Admitting: Hematology and Oncology

## 2024-07-26 ENCOUNTER — Other Ambulatory Visit

## 2024-07-29 ENCOUNTER — Other Ambulatory Visit: Payer: Self-pay | Admitting: *Deleted

## 2024-07-29 DIAGNOSIS — C50411 Malignant neoplasm of upper-outer quadrant of right female breast: Secondary | ICD-10-CM

## 2024-07-30 ENCOUNTER — Inpatient Hospital Stay

## 2024-07-30 ENCOUNTER — Inpatient Hospital Stay: Admitting: Hematology and Oncology

## 2024-07-30 ENCOUNTER — Inpatient Hospital Stay: Attending: Hematology and Oncology

## 2024-07-30 ENCOUNTER — Encounter: Payer: Self-pay | Admitting: Hematology and Oncology

## 2024-07-30 VITALS — BP 118/62 | HR 82 | Temp 98.0°F | Resp 16 | Wt 144.8 lb

## 2024-07-30 DIAGNOSIS — C50411 Malignant neoplasm of upper-outer quadrant of right female breast: Secondary | ICD-10-CM

## 2024-07-30 DIAGNOSIS — M810 Age-related osteoporosis without current pathological fracture: Secondary | ICD-10-CM | POA: Diagnosis present

## 2024-07-30 DIAGNOSIS — Z170 Estrogen receptor positive status [ER+]: Secondary | ICD-10-CM | POA: Insufficient documentation

## 2024-07-30 LAB — CBC WITH DIFFERENTIAL (CANCER CENTER ONLY)
Abs Immature Granulocytes: 0.02 K/uL (ref 0.00–0.07)
Basophils Absolute: 0.1 K/uL (ref 0.0–0.1)
Basophils Relative: 1 %
Eosinophils Absolute: 0.1 K/uL (ref 0.0–0.5)
Eosinophils Relative: 2 %
HCT: 44 % (ref 36.0–46.0)
Hemoglobin: 14.4 g/dL (ref 12.0–15.0)
Immature Granulocytes: 0 %
Lymphocytes Relative: 28 %
Lymphs Abs: 1.9 K/uL (ref 0.7–4.0)
MCH: 28.8 pg (ref 26.0–34.0)
MCHC: 32.7 g/dL (ref 30.0–36.0)
MCV: 88 fL (ref 80.0–100.0)
Monocytes Absolute: 0.5 K/uL (ref 0.1–1.0)
Monocytes Relative: 8 %
Neutro Abs: 4 K/uL (ref 1.7–7.7)
Neutrophils Relative %: 61 %
Platelet Count: 219 K/uL (ref 150–400)
RBC: 5 MIL/uL (ref 3.87–5.11)
RDW: 13.8 % (ref 11.5–15.5)
WBC Count: 6.7 K/uL (ref 4.0–10.5)
nRBC: 0 % (ref 0.0–0.2)

## 2024-07-30 LAB — CMP (CANCER CENTER ONLY)
ALT: 18 U/L (ref 0–44)
AST: 21 U/L (ref 15–41)
Albumin: 4.4 g/dL (ref 3.5–5.0)
Alkaline Phosphatase: 58 U/L (ref 38–126)
Anion gap: 8 (ref 5–15)
BUN: 11 mg/dL (ref 8–23)
CO2: 27 mmol/L (ref 22–32)
Calcium: 9.6 mg/dL (ref 8.9–10.3)
Chloride: 105 mmol/L (ref 98–111)
Creatinine: 0.81 mg/dL (ref 0.44–1.00)
GFR, Estimated: >60 mL/min (ref >60)
Glucose, Bld: 104 mg/dL — ABNORMAL HIGH (ref 70–99)
Potassium: 4 mmol/L (ref 3.5–5.1)
Sodium: 140 mmol/L (ref 135–145)
Total Bilirubin: 0.4 mg/dL (ref 0.0–1.2)
Total Protein: 7.4 g/dL (ref 6.5–8.1)

## 2024-07-30 MED ORDER — ANASTROZOLE 1 MG PO TABS: 1.0000 mg | ORAL_TABLET | Freq: Every day | ORAL | 3 refills | Status: AC

## 2024-07-30 MED ORDER — ZOLEDRONIC ACID 4 MG/100ML IV SOLN
4.0000 mg | Freq: Once | INTRAVENOUS | Status: AC
  Administered 2024-07-30: 4 mg via INTRAVENOUS
  Filled 2024-07-30: qty 100

## 2024-07-30 MED ORDER — SODIUM CHLORIDE 0.9% FLUSH
10.0000 mL | Freq: Two times a day (BID) | INTRAVENOUS | Status: DC
  Administered 2024-07-30: 10 mL via INTRAVENOUS

## 2024-07-30 NOTE — Patient Instructions (Signed)

## 2024-07-30 NOTE — Progress Notes (Unsigned)
 [facility] CONSULT NOTE  Patient Care Team: Shona Norleen PEDLAR, MD as PCP - General (Internal Medicine) Shaaron, Lamar HERO, MD as Consulting Physician (Gastroenterology) Loretha Ash, MD as Consulting Physician (Hematology and Oncology) Izell Domino, MD as Attending Physician (Radiation Oncology) Vanderbilt Ned, MD as Consulting Physician (General Surgery)  CHIEF COMPLAINTS/PURPOSE OF CONSULTATION:  Newly diagnosed breast cancer  HISTORY OF PRESENTING ILLNESS:  Connie May 68 y.o. female is here because of recent diagnosis of right breast cancer  I reviewed her records extensively and collaborated the history with the patient.  SUMMARY OF ONCOLOGIC HISTORY: Oncology History  Malignant neoplasm of upper-outer quadrant of right breast in female, estrogen receptor positive (HCC)  01/20/2022 Mammogram   Mammogram done on April 20 showed right breast focal asymmetry requiring further evaluation of a left breast asymmetry requiring further evaluation.  Diagnostic mammogram done showed persistent distortion in the upper outer right breast without definite sonographic correlate.  No abnormal appearing right axillary lymph nodes.  No persistent mammographic abnormality in the area of the left breast screening study finding.   02/24/2022 Pathology Results   Stereotactic biopsy from the right breast at 9 o'clock position showed invasive well-differentiated ductal adenocarcinoma grade 1 measuring 4 mm in greatest extent, focal intermediate grade DCIS.  Prognostic showed ER 100% positive strong staining PR 0% negative, Ki-67 of 10%, HER2 negative   03/25/2022 Cancer Staging   Staging form: Breast, AJCC 8th Edition - Clinical stage from 03/25/2022: Stage Unknown (cTX, cN0, cM0, G1, ER+, PR-, HER2-) - Signed by Loretha Ash, MD on 03/25/2022 Nuclear grade: G1 Histologic grading system: 3 grade system   04/28/2022 Surgery   RIGHT BREAST, LUMPECTOMY: pT1c, pN0, Oncotype DX Breast  Recurrence Score is 17  Final pathology showed grade 2 invasive moderately differentiated adenocarcinoma with focal DCIS.  Tumor measured 1.6 x 0.7 x 0.5 cm, negative margins.  3 lymph nodes negative for carcinoma   04/28/2022 Cancer Staging   Staging form: Breast, AJCC 8th Edition - Pathologic stage from 04/28/2022: Stage IA (pT1c, pN0, cM0, G2, ER+, PR-, HER2-) - Signed by Crawford Morna Pickle, NP on 11/28/2022 Stage prefix: Initial diagnosis Histologic grading system: 3 grade system   05/17/2022 Oncotype testing   17/5%   06/08/2022 - 07/06/2022 Radiation Therapy   06/08/2022 through 07/06/2022 Site Technique Total Dose (Gy) Dose per Fx (Gy) Completed Fx Beam Energies  Breast, Right: Breast_R 3D 40.05/40.05 2.67 15/15 6X  Breast, Right: Breast_R_Bst 3D 10/10 2 5/5 6X, 10X     09/2022 -  Anti-estrogen oral therapy   Anastrozole  daily    History of Present Illness    The patient, with a history of osteoporosis and breast cancer, here for follow up.  Discussed the use of AI scribe software for clinical note transcription with the patient, who gave verbal consent to proceed.  History of Present Illness Connie May is a 68 year old female who presents for follow up of her BC and osteoporosis.    Rest of the pertinent 10 point ROS reviewed and neg.  MEDICAL HISTORY:  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   Depression    Headache    Hyperlipidemia    Nephrolithiasis    history of   Panic attacks     SURGICAL HISTORY: Past Surgical History:  Procedure Laterality Date   APPENDECTOMY     BIOPSY  12/17/2018   Procedure:  BIOPSY;  Surgeon: Golda Claudis PENNER, MD;  Location: [facility];  Service: Endoscopy;;   BREAST LUMPECTOMY WITH RADIOACTIVE SEED AND SENTINEL LYMPH NODE BIOPSY Right 04/28/2022   Procedure: RIGHT BREAST LUMPECTOMY WITH RADIOACTIVE SEED AND SENTINEL LYMPH NODE BIOPSY;  Surgeon: Vanderbilt Ned, MD;  Location: [facility];  Service: General;  Laterality: Right;  GEN & PEC BLOCK   calculus retrieval     x 2    COLONOSCOPY N/A 12/17/2018   Procedure: COLONOSCOPY;  Surgeon: Golda Claudis PENNER, MD;  Location: [facility];  Service: Endoscopy;  Laterality: N/A;   open RT knee surgery     POLYPECTOMY  12/17/2018   Procedure: POLYPECTOMY;  Surgeon: Golda Claudis PENNER, MD;  Location: [facility];  Service: Endoscopy;;    SOCIAL HISTORY: Social History   Socioeconomic History   Marital status: Married    Spouse name: Not on file   Number of children: 2    Years of education: Not on file   Highest education level: Not on file  Occupational History    Employer: [employer]  Tobacco Use   Smoking status: Never   Smokeless tobacco: Never  Vaping Use   Vaping status: Never Used  Substance and Sexual Activity   Alcohol use: Not Currently   Drug use: Not Currently    Types: Marijuana   Sexual activity: Not Currently    Birth control/protection: Post-menopausal  Other Topics Concern   Not on file  Social History Narrative   No regular exercise.    Social Drivers of Corporate Investment Banker Strain: Not on file  Food Insecurity: Not on file  Transportation Needs: Not on file  Physical Activity: Not on file  Stress: Not on file  Social Connections: Not on file  Intimate Partner Violence: Not on file    FAMILY HISTORY: Family History  Problem Relation Age of Onset   Stroke Mother    Depression Father    Prostate cancer Father    Lung cancer Father    Bipolar disorder Sister        ]   Hepatitis Sister    Stroke Maternal Grandmother    Colon cancer Neg Hx     ALLERGIES:  is allergic to hydrocodone -acetaminophen .  MEDICATIONS:  Current Outpatient Medications  Medication Sig Dispense Refill   anastrozole  (ARIMIDEX ) 1 MG tablet Take 1 tablet by mouth once daily 90 tablet 0   aspirin -acetaminophen -caffeine (EXCEDRIN MIGRAINE) 250-250-65 MG  tablet 1 tablet every 6 (six) hours as needed for headache or migraine (Takes BC powder).     clonazePAM (KLONOPIN) 1 MG tablet Take 1 mg by mouth at bedtime as needed for anxiety. For anxiety     ibuprofen  (ADVIL ) 800 MG tablet Take 1 tablet (800 mg total) by mouth every 8 (eight) hours as needed. 30 tablet 0   NURTEC 75 MG TBDP Take 1 tablet by mouth as needed.     omeprazole (PRILOSEC) 40 MG capsule Take 40 mg by mouth daily.     rosuvastatin (CRESTOR) 5 MG tablet Take 5 mg by mouth daily.     venlafaxine XR (EFFEXOR-XR) 75 MG 24 hr capsule Take 75 mg by mouth daily.     No current facility-administered medications for this visit.    REVIEW OF SYSTEMS:   Constitutional: Denies fevers, chills or abnormal night sweats Eyes: Denies blurriness of vision, double vision or watery eyes Ears, nose, mouth, throat, and face: Denies mucositis or sore throat  Respiratory: Denies cough, dyspnea or wheezes Cardiovascular: Denies palpitation, chest discomfort or lower extremity swelling Gastrointestinal:  Denies nausea, heartburn or change in bowel habits Skin: Denies abnormal skin rashes Lymphatics: Denies new lymphadenopathy or easy bruising Neurological:Denies numbness, tingling or new weaknesses Behavioral/Psych: Mood is stable, no new changes  Breast: Denies any palpable lumps or discharge All other systems were reviewed with the patient and are negative.  PHYSICAL EXAMINATION: ECOG PERFORMANCE STATUS: 0 - Asymptomatic  Vitals:   07/30/24 1114  BP: 118/62  Pulse: 82  Resp: 16  Temp: 98 F (36.7 C)  SpO2: 99%     Filed Weights   07/30/24 1114  Weight: 144 lb 12.8 oz (65.7 kg)    General appearance: Alert, oriented and in no acute distress Bilateral breasts examined, no palpable masses or regional adenopathy.   LABORATORY DATA:  I have reviewed the data as listed Lab Results  Component Value Date   WBC 6.7 07/30/2024   HGB 14.4 07/30/2024   HCT 44.0 07/30/2024   MCV 88.0  07/30/2024   PLT 219 07/30/2024   Lab Results  Component Value Date   NA 140 01/25/2024   K 4.2 01/25/2024   CL 107 01/25/2024   CO2 28 01/25/2024    RADIOGRAPHIC STUDIES: I have personally reviewed the radiological reports and agreed with the findings in the report.  ASSESSMENT AND PLAN:  No problem-specific Assessment & Plan notes found for this encounter.   Total time spent: 30 minutes involving history, review of records, physical exam, counseling and coordination of care All questions were answered. The patient knows to call the clinic with any problems, questions or concerns.    Amber Stalls, MD 07/30/24

## 2024-07-31 ENCOUNTER — Telehealth: Payer: Self-pay

## 2024-07-31 ENCOUNTER — Encounter: Payer: Self-pay | Admitting: Hematology and Oncology

## 2024-07-31 NOTE — Telephone Encounter (Signed)
 Orders entered for Guardant Reveal per MD. Requisition and all supporting documents faxed with confirmation.

## 2024-07-31 NOTE — Assessment & Plan Note (Signed)
 This is a very pleasant 68 year old postmenopausal female patient with no significant past medical history referred to breast oncology given new diagnosis of right breast invasive ductal carcinoma status post right breast lumpectomy with final pathology showing grade 2 invasive moderately differentiated adenocarcinoma with focal DCIS, 3 lymph nodes negative for carcinoma.  Oncotype DX score resulted at 17, distant risk of recurrence at 9 years is about 5% and no benefit of chemo in this age group.  She  completed adjuvant radiation, now on anastrozole .  Assessment and Plan Assessment & Plan Breast cancer On anastrozole  and under surveillance.  - Refill anastrozole  at the same pharmacy. - Discussed about MRD testing, she is interested - Provided her a pamphlet about guardant reveal.  Osteoporosis Managed with Zometa .  - Administer Zometa  every six months. - Schedule next Zometa  injection at checkout. - Recommend starting vitamin D supplementation. - Recommend weight-bearing exercises. - No new dental concerns reported.  Menopausal symptoms (hot flashes) Continues to experience hot flashes despite being on Effexor (venlafaxine). She doesn't want to try anything new.

## 2024-08-05 ENCOUNTER — Telehealth: Payer: Self-pay

## 2024-08-05 NOTE — Telephone Encounter (Signed)
 Who is your primary care physician: Dr.Zach Shona  Reasons for the colonoscopy: Hx polyps  Have you had a colonoscopy before?  Yes 12-17-2018  Do you have family history of colon cancer? no  Previous colonoscopy with polyps removed? yes  Do you have a history colorectal cancer?   no  Are you diabetic? If yes, Type 1 or Type 2?    no  Do you have a prosthetic or mechanical heart valve? no  Do you have a pacemaker/defibrillator?   no  Have you had endocarditis/atrial fibrillation? no  Have you had joint replacement within the last 12 months?  no  Do you tend to be constipated or have to use laxatives? no  Do you have any history of drugs or alchohol?  no  Do you use supplemental oxygen?  no  Have you had a stroke or heart attack within the last 6 months? no  Do you take weight loss medication?  no  For female patients: have you had a hysterectomy?  no                                     are you post menopausal?       yes                                            do you still have your menstrual cycle? no      Do you take any blood-thinning medications such as: (aspirin , warfarin, Plavix, Aggrenox)  no  If yes we need the name, milligram, dosage and who is prescribing doctor  Current Outpatient Medications on File Prior to Visit  Medication Sig Dispense Refill   anastrozole  (ARIMIDEX ) 1 MG tablet Take 1 tablet (1 mg total) by mouth daily. 90 tablet 3   clonazePAM (KLONOPIN) 1 MG tablet Take 1 mg by mouth at bedtime as needed for anxiety. For anxiety     omeprazole (PRILOSEC) 40 MG capsule Take 40 mg by mouth daily.     rosuvastatin (CRESTOR) 5 MG tablet Take 5 mg by mouth daily.     No current facility-administered medications on file prior to visit.    Allergies  Allergen Reactions   Hydrocodone -Acetaminophen  Nausea And Vomiting and Rash     Pharmacy: [facility]  Primary Insurance Name: Medicare [id number]  Best number where you can be reached:  [phone]

## 2024-08-07 ENCOUNTER — Encounter (INDEPENDENT_AMBULATORY_CARE_PROVIDER_SITE_OTHER): Payer: Self-pay | Admitting: *Deleted

## 2024-08-07 ENCOUNTER — Other Ambulatory Visit: Payer: Self-pay | Admitting: *Deleted

## 2024-08-07 ENCOUNTER — Encounter: Payer: Self-pay | Admitting: *Deleted

## 2024-08-07 ENCOUNTER — Encounter: Payer: Self-pay | Admitting: Hematology and Oncology

## 2024-08-07 MED ORDER — PEG 3350-KCL-NA BICARB-NACL 420 G PO SOLR: 4000.0000 mL | Freq: Once | ORAL | 0 refills | Status: AC

## 2024-08-07 NOTE — Telephone Encounter (Signed)
 Pt has been scheduled for 09/04/24. Instructions mailed and prep sent to pharmacy.

## 2024-08-07 NOTE — Telephone Encounter (Signed)
 Referral completed, TCS apt letter sent to PCP

## 2024-09-04 ENCOUNTER — Encounter (HOSPITAL_COMMUNITY)
Admission: RE | Disposition: A | Discharge status: Home / Self Care | Payer: Self-pay | Source: Home / Self Care | Attending: Gastroenterology

## 2024-09-04 ENCOUNTER — Other Ambulatory Visit: Payer: Self-pay

## 2024-09-04 ENCOUNTER — Ambulatory Visit (HOSPITAL_COMMUNITY)

## 2024-09-04 ENCOUNTER — Encounter (HOSPITAL_COMMUNITY): Payer: Self-pay | Admitting: Gastroenterology

## 2024-09-04 ENCOUNTER — Ambulatory Visit (HOSPITAL_COMMUNITY)
Admission: RE | Admit: 2024-09-04 | Discharge: 2024-09-04 | Disposition: A | Discharge status: Home / Self Care | Attending: Gastroenterology | Admitting: Gastroenterology

## 2024-09-04 DIAGNOSIS — D123 Benign neoplasm of transverse colon: Secondary | ICD-10-CM

## 2024-09-04 DIAGNOSIS — K5730 Diverticulosis of large intestine without perforation or abscess without bleeding: Secondary | ICD-10-CM | POA: Diagnosis not present

## 2024-09-04 DIAGNOSIS — Z860101 Personal history of adenomatous and serrated colon polyps: Secondary | ICD-10-CM

## 2024-09-04 DIAGNOSIS — D124 Benign neoplasm of descending colon: Secondary | ICD-10-CM

## 2024-09-04 DIAGNOSIS — Z1211 Encounter for screening for malignant neoplasm of colon: Secondary | ICD-10-CM

## 2024-09-04 DIAGNOSIS — Z860100 Personal history of colon polyps, unspecified: Secondary | ICD-10-CM

## 2024-09-04 DIAGNOSIS — K648 Other hemorrhoids: Secondary | ICD-10-CM

## 2024-09-04 HISTORY — PX: COLONOSCOPY: SHX5424

## 2024-09-04 HISTORY — PX: POLYPECTOMY: SHX149

## 2024-09-04 LAB — HM COLONOSCOPY

## 2024-09-04 SURGERY — COLONOSCOPY
Anesthesia: General

## 2024-09-04 MED ORDER — LACTATED RINGERS IV SOLN: INTRAVENOUS | Status: DC

## 2024-09-04 MED ORDER — PROPOFOL 500 MG/50ML IV EMUL
INTRAVENOUS | Status: DC | PRN
  Administered 2024-09-04 (×2): 100 mg via INTRAVENOUS
  Administered 2024-09-04: 40 mg via INTRAVENOUS
  Administered 2024-09-04 (×3): 50 mg via INTRAVENOUS

## 2024-09-04 NOTE — Anesthesia Postprocedure Evaluation (Signed)
 Anesthesia Post Note  Patient: Connie May  Procedure(s) Performed: COLONOSCOPY POLYPECTOMY, INTESTINE  Patient location during evaluation: PACU Anesthesia Type: General Level of consciousness: awake and alert Pain management: pain level controlled Vital Signs Assessment: post-procedure vital signs reviewed and stable Respiratory status: spontaneous breathing, nonlabored ventilation and respiratory function stable Cardiovascular status: blood pressure returned to baseline and stable Postop Assessment: no apparent nausea or vomiting Anesthetic complications: no   No notable events documented.   Last Vitals:  Vitals:   09/04/24 1109 09/04/24 1113  BP: (!) 99/48 101/62  Pulse: 77 71  Resp: 15 20  Temp: 36.4 C   SpO2: 95% 96%    Last Pain:  Vitals:   09/04/24 1113  TempSrc:   PainSc: 0-No pain                 Andrea Limes

## 2024-09-04 NOTE — Discharge Instructions (Signed)
 You are being discharged to home.  Resume your previous diet.  We are waiting for your pathology results.  Your physician has recommended a repeat colonoscopy for surveillance based on pathology results.

## 2024-09-04 NOTE — Op Note (Addendum)
 [facility] Patient Name: Connie May Procedure Date: 09/04/2024 10:23 AM MRN: [id number] Date of Birth: Jul 02, 1956 Attending MD: Toribio Fortune , , [id number] CSN: [id number] Age: 68 Admit Type: Inpatient Procedure:                Colonoscopy Indications:              Surveillance: Personal history of adenomatous                            polyps on last colonoscopy 5 years ago Providers:                Toribio Fortune, Jon A. Gerome RN, RN, Chad                            Wilson, Technician Referring MD:              Medicines:                Monitored Anesthesia Care Complications:            No immediate complications. Estimated Blood Loss:     Estimated blood loss: none. Procedure:                Pre-Anesthesia Assessment:                           - Prior to the procedure, a History and Physical                            was performed, and patient medications, allergies                            and sensitivities were reviewed. The patient's                            tolerance of previous anesthesia was reviewed.                           - The risks and benefits of the procedure and the                            sedation options and risks were discussed with the                            patient. All questions were answered and informed                            consent was obtained.                           - ASA Grade Assessment: II - A patient with mild                            systemic disease.                           After obtaining informed consent, the colonoscope  was passed under direct vision. Throughout the                            procedure, the patient's blood pressure, pulse, and                            oxygen saturations were monitored continuously. The                            [id number]-[id number] ([id number]) Peds Colon was introduced                            through the anus and advanced to the the cecum,                             identified by appendiceal orifice and ileocecal                            valve. The colonoscopy was performed without                            difficulty. The patient tolerated the procedure                            well. The quality of the bowel preparation was good. Scope In: 10:43:46 AM Scope Out: 11:02:29 AM Scope Withdrawal Time: 0 hours 12 minutes 43 seconds  Total Procedure Duration: 0 hours 18 minutes 43 seconds  Findings:      The perianal and digital rectal examinations were normal.      Two sessile polyps were found in the transverse colon. The polyps were 3       to 5 mm in size. These polyps were removed with a cold snare. Resection       and retrieval were complete.      A 3 mm polyp was found in the descending colon. The polyp was sessile.       The polyp was removed with a cold snare. Resection and retrieval were       complete.      Scattered medium-mouthed and small-mouthed diverticula were found in the       sigmoid colon and ascending colon.      Non-bleeding internal hemorrhoids were found during retroflexion. The       hemorrhoids were small. Impression:               - Two 3 to 5 mm polyps in the transverse colon,                            removed with a cold snare. Resected and retrieved.                           - One 3 mm polyp in the descending colon, removed                            with a cold snare. Resected and retrieved.                           -  Diverticulosis in the sigmoid colon and in the                            ascending colon.                           - Non-bleeding internal hemorrhoids. Moderate Sedation:      Per Anesthesia Care Recommendation:           - Discharge patient to home (ambulatory).                           - Resume previous diet.                           - Await pathology results.                           - Repeat colonoscopy for surveillance based on                            pathology  results. Procedure Code(s):        --- Professional ---                           [phone], Colonoscopy, flexible; with removal of                            tumor(s), polyp(s), or other lesion(s) by snare                            technique Diagnosis Code(s):        --- Professional ---                           Z86.010, Personal history of colonic polyps                           D12.3, Benign neoplasm of transverse colon (hepatic                            flexure or splenic flexure)                           D12.4, Benign neoplasm of descending colon                           K64.8, Other hemorrhoids                           K57.30, Diverticulosis of large intestine without                            perforation or abscess without bleeding CPT copyright 2022 American Medical Association. All rights reserved. The codes documented in this report are preliminary and upon coder review may  be revised to meet current compliance requirements. Toribio Fortune, MD Toribio Fortune,  09/04/2024 11:07:46 AM This report has been signed  electronically. Number of Addenda: 0

## 2024-09-04 NOTE — Anesthesia Preprocedure Evaluation (Signed)
 Anesthesia Evaluation  Patient identified by MRN, date of birth, ID band Patient awake    Reviewed: Allergy & Precautions, H&P , NPO status , Patient's Chart, lab work & pertinent test results  Airway Mallampati: II  TM Distance: >3 FB Neck ROM: Full    Dental no notable dental hx.    Pulmonary neg pulmonary ROS   Pulmonary exam normal breath sounds clear to auscultation       Cardiovascular negative cardio ROS Normal cardiovascular exam Rhythm:Regular Rate:Normal     Neuro/Psych  Headaches PSYCHIATRIC DISORDERS Anxiety Depression     Neuromuscular disease    GI/Hepatic Neg liver ROS, hiatal hernia,GERD  ,,  Endo/Other  negative endocrine ROS    Renal/GU Renal disease  negative genitourinary   Musculoskeletal negative musculoskeletal ROS (+)    Abdominal   Peds negative pediatric ROS (+)  Hematology negative hematology ROS (+)   Anesthesia Other Findings   Reproductive/Obstetrics negative OB ROS                              Anesthesia Physical Anesthesia Plan  ASA: 2  Anesthesia Plan: General   Post-op Pain Management:    Induction: Intravenous  PONV Risk Score and Plan:   Airway Management Planned: Nasal Cannula  Additional Equipment:   Intra-op Plan:   Post-operative Plan:   Informed Consent: I have reviewed the patients History and Physical, chart, labs and discussed the procedure including the risks, benefits and alternatives for the proposed anesthesia with the patient or authorized representative who has indicated his/her understanding and acceptance.     Dental advisory given  Plan Discussed with: CRNA  Anesthesia Plan Comments:         Anesthesia Quick Evaluation

## 2024-09-04 NOTE — H&P (Signed)
 Connie May is an 68 y.o. female.   Chief Complaint: History of colon polyps HPI: 68 year old female with past medical history of breast cancer, GERD, depression, hyperlipidemia, nephrolithiasis, prediabetes, coming for evaluation of history of colon polyps.  Last colonoscopy was performed in 2020, had 2 tubular adenomas removed.  The patient denies having any complaints such as melena, hematochezia, abdominal pain or distention, change in her bowel movement consistency or frequency, no changes in weight recently.  No family history of colorectal cancer.   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   COVID-19 03/15/2021   DDD (degenerative disc disease), cervical 08/18/2022   Depression    Gastroesophageal reflux disease without esophagitis 08/18/2022   Generalized anxiety disorder 06/09/2021   Headache    Hiatal hernia 08/18/2022   Hyperlipidemia    Hypernatremia 08/17/2022   Impaired fasting glucose 08/17/2022   Memory impairment 04/03/2024   Nephrolithiasis    history of   Osteoporosis 03/21/2023   Pain in left arm 07/14/2023   Panic attacks    Prediabetes 03/20/2023   Sleep paralysis 03/21/2023    Past Surgical History:  Procedure Laterality Date   APPENDECTOMY     BIOPSY  12/17/2018   Procedure: BIOPSY;  Surgeon: Golda Claudis PENNER, MD;  Location: [facility];  Service: Endoscopy;;   BREAST LUMPECTOMY WITH RADIOACTIVE SEED AND SENTINEL LYMPH NODE BIOPSY Right 04/28/2022   Procedure: RIGHT BREAST LUMPECTOMY WITH RADIOACTIVE SEED AND SENTINEL LYMPH NODE BIOPSY;  Surgeon: Vanderbilt Ned, MD;  Location: [facility];  Service: General;  Laterality: Right;  GEN & PEC BLOCK   calculus retrieval     x 2    COLONOSCOPY N/A 12/17/2018   Procedure: COLONOSCOPY;  Surgeon: Golda Claudis PENNER, MD;  Location: [facility];  Service: Endoscopy;  Laterality: N/A;   open RT knee  surgery     POLYPECTOMY  12/17/2018   Procedure: POLYPECTOMY;  Surgeon: Golda Claudis PENNER, MD;  Location: [facility];  Service: Endoscopy;;    Family History  Problem Relation Age of Onset   Stroke Mother    Depression Father    Prostate cancer Father    Lung cancer Father    Bipolar disorder Sister        ]   Hepatitis Sister    Stroke Maternal Grandmother    Colon cancer Neg Hx    Social History:  reports that she has never smoked. She has never used smokeless tobacco. She reports that she does not currently use alcohol. She reports that she does not currently use drugs.  Allergies:  Allergies  Allergen Reactions   Hydrocodone -Acetaminophen  Nausea And Vomiting and Rash    Medications Prior to Admission  Medication Sig Dispense Refill   anastrozole  (ARIMIDEX ) 1 MG tablet Take 1 tablet (1 mg total) by mouth daily. 90 tablet 3   cetirizine (ZYRTEC) 5 MG chewable tablet Chew 5 mg by mouth daily.     Cholecalciferol (VITAMIN D-3 PO) Take by mouth.     clonazePAM (KLONOPIN) 1 MG tablet Take 1 mg by mouth at bedtime as needed for anxiety. For anxiety     omeprazole (PRILOSEC) 40 MG capsule Take 40 mg by mouth daily.     rosuvastatin (CRESTOR) 5 MG tablet Take 5 mg by mouth daily.      No results found for this or any previous visit (from the  past 48 hours). No results found.  Review of Systems  All other systems reviewed and are negative.   Blood pressure 102/67, pulse 74, temperature 97.6 F (36.4 C), temperature source Oral, resp. rate 16, height 5' (1.524 m), weight 65.3 kg, SpO2 100%. Physical Exam  GENERAL: The patient is AO x3, in no acute distress. HEENT: Head is normocephalic and atraumatic. EOMI are intact. Mouth is well hydrated and without lesions. NECK: Supple. No masses LUNGS: Clear to auscultation. No presence of rhonchi/wheezing/rales. Adequate chest expansion HEART: RRR, normal s1 and s2. ABDOMEN: Soft, nontender, no guarding, no peritoneal signs, and  nondistended. BS +. No masses. EXTREMITIES: Without any cyanosis, clubbing, rash, lesions or edema. NEUROLOGIC: AOx3, no focal motor deficit. SKIN: no jaundice, no rashes  Assessment/Plan 68 year old female with past medical history of breast cancer, GERD, depression, hyperlipidemia, nephrolithiasis, prediabetes, coming for evaluation of history of colon polyps.  Will proceed with colonoscopy.  Toribio Eartha Flavors, MD 09/04/2024, 10:30 AM

## 2024-09-04 NOTE — Anesthesia Procedure Notes (Signed)
 Date/Time: 09/04/2024 10:40 AM  Performed by: Barbarann Verneita RAMAN, CRNAPre-anesthesia Checklist: Patient identified, Emergency Drugs available, Suction available, Timeout performed and Patient being monitored Patient Re-evaluated:Patient Re-evaluated prior to induction Oxygen Delivery Method: Nasal cannula Comments: Optiflow

## 2024-09-04 NOTE — Transfer of Care (Signed)
 Immediate Anesthesia Transfer of Care Note  Patient: Connie May  Procedure(s) Performed: COLONOSCOPY POLYPECTOMY, INTESTINE  Patient Location: Endoscopy Unit  Anesthesia Type:General  Level of Consciousness: drowsy  Airway & Oxygen Therapy: Patient Spontanous Breathing  Post-op Assessment: Report given to RN and Post -op Vital signs reviewed and stable  Post vital signs: Reviewed and stable  Last Vitals:  Vitals Value Taken Time  BP 99/48 09/04/24 11:09  Temp 36.4 C 09/04/24 11:09  Pulse 77 09/04/24 11:09  Resp 15 09/04/24 11:09  SpO2 95 % 09/04/24 11:09    Last Pain:  Vitals:   09/04/24 1109  TempSrc: Oral  PainSc:       Patients Stated Pain Goal: 5 (09/04/24 0932)  Complications: No notable events documented.

## 2024-09-04 NOTE — Anesthesia Procedure Notes (Signed)
 Date/Time: 09/04/2024 10:35 AM  Performed by: Barbarann Verneita RAMAN, CRNAPre-anesthesia Checklist: Patient identified, Emergency Drugs available, Suction available, Timeout performed and Patient being monitored Patient Re-evaluated:Patient Re-evaluated prior to induction Oxygen Delivery Method: Nasal Cannula

## 2024-09-05 ENCOUNTER — Encounter (INDEPENDENT_AMBULATORY_CARE_PROVIDER_SITE_OTHER): Payer: Self-pay | Admitting: *Deleted

## 2024-09-05 ENCOUNTER — Ambulatory Visit (INDEPENDENT_AMBULATORY_CARE_PROVIDER_SITE_OTHER): Payer: Self-pay | Admitting: Gastroenterology

## 2024-09-05 ENCOUNTER — Encounter (HOSPITAL_COMMUNITY): Payer: Self-pay | Admitting: Gastroenterology

## 2024-09-05 LAB — SURGICAL PATHOLOGY

## 2024-09-09 ENCOUNTER — Encounter (HOSPITAL_COMMUNITY): Payer: Self-pay | Admitting: Gastroenterology

## 2024-09-12 NOTE — Progress Notes (Signed)
 5 yr TCS noted in recall Patient result letter mailed procedure note and pathology result faxed to PCP

## 2024-09-30 ENCOUNTER — Other Ambulatory Visit: Payer: Self-pay | Admitting: *Deleted

## 2024-09-30 ENCOUNTER — Other Ambulatory Visit: Payer: Self-pay | Admitting: Hematology and Oncology

## 2024-09-30 DIAGNOSIS — N951 Menopausal and female climacteric states: Secondary | ICD-10-CM

## 2024-09-30 MED ORDER — OXYBUTYNIN CHLORIDE 5 MG PO TABS: 2.5000 mg | ORAL_TABLET | Freq: Two times a day (BID) | ORAL | Status: DC

## 2024-09-30 MED ORDER — OXYBUTYNIN CHLORIDE 5 MG PO TABS: 2.5000 mg | ORAL_TABLET | Freq: Two times a day (BID) | ORAL | 0 refills | Status: DC

## 2024-09-30 NOTE — Progress Notes (Signed)
 Pt called w/ complaints of increased night sweats and hot flashes. Advised that new rx ditropan with be sent to pharmacy to take BID and will f/u with phone visit in 2 weeks. Pt verbalized understanding.

## 2024-09-30 NOTE — Addendum Note (Signed)
 Addended by: Armanie Ullmer on: 09/30/2024 02:56 PM   Modules accepted: Orders

## 2024-09-30 NOTE — Progress Notes (Signed)
 Oxybutynin prescribed for uncontrolled vasomotor symptoms.  Connie May

## 2024-10-21 ENCOUNTER — Other Ambulatory Visit: Payer: Self-pay | Admitting: Hematology and Oncology

## 2024-10-22 ENCOUNTER — Encounter: Payer: Self-pay | Admitting: Hematology and Oncology

## 2025-01-28 ENCOUNTER — Inpatient Hospital Stay

## 2025-01-28 ENCOUNTER — Inpatient Hospital Stay: Admitting: Hematology and Oncology
